# Patient Record
Sex: Female | Born: 1965 | Race: Black or African American | Hispanic: No | Marital: Married | Smoking: Former smoker
Health system: Southern US, Community
[De-identification: ages and names within clinical notes are randomized; demographics above are authoritative.]

## PROBLEM LIST (undated history)

## (undated) DIAGNOSIS — A048 Other specified bacterial intestinal infections: Secondary | ICD-10-CM

## (undated) DIAGNOSIS — K219 Gastro-esophageal reflux disease without esophagitis: Secondary | ICD-10-CM

## (undated) DIAGNOSIS — D649 Anemia, unspecified: Secondary | ICD-10-CM

## (undated) HISTORY — PX: TUBAL LIGATION: SHX77

## (undated) HISTORY — PX: UPPER GI ENDOSCOPY: SHX6162

## (undated) HISTORY — DX: Anemia, unspecified: D64.9

## (undated) HISTORY — DX: Other specified bacterial intestinal infections: A04.8

## (undated) HISTORY — DX: Gastro-esophageal reflux disease without esophagitis: K21.9

---

## 1999-04-30 ENCOUNTER — Other Ambulatory Visit: Admission: RE | Admit: 1999-04-30 | Discharge: 1999-04-30 | Payer: Self-pay | Admitting: Obstetrics and Gynecology

## 2003-01-20 ENCOUNTER — Encounter: Payer: Self-pay | Admitting: Emergency Medicine

## 2003-01-21 ENCOUNTER — Observation Stay (HOSPITAL_COMMUNITY): Admission: EM | Admit: 2003-01-21 | Discharge: 2003-01-21 | Payer: Self-pay | Admitting: *Deleted

## 2003-01-21 ENCOUNTER — Encounter: Payer: Self-pay | Admitting: Internal Medicine

## 2003-08-27 ENCOUNTER — Emergency Department (HOSPITAL_COMMUNITY): Admission: EM | Admit: 2003-08-27 | Discharge: 2003-08-27 | Payer: Self-pay | Admitting: Emergency Medicine

## 2003-09-25 ENCOUNTER — Ambulatory Visit (HOSPITAL_COMMUNITY): Admission: RE | Admit: 2003-09-25 | Discharge: 2003-09-25 | Payer: Self-pay | Admitting: Family Medicine

## 2003-10-08 ENCOUNTER — Ambulatory Visit (HOSPITAL_COMMUNITY): Admission: RE | Admit: 2003-10-08 | Discharge: 2003-10-08 | Payer: Self-pay | Admitting: Family Medicine

## 2003-10-09 ENCOUNTER — Encounter: Admission: RE | Admit: 2003-10-09 | Discharge: 2003-10-09 | Payer: Self-pay | Admitting: Family Medicine

## 2004-03-31 ENCOUNTER — Ambulatory Visit: Payer: Self-pay | Admitting: Family Medicine

## 2004-04-02 ENCOUNTER — Ambulatory Visit (HOSPITAL_COMMUNITY): Admission: RE | Admit: 2004-04-02 | Discharge: 2004-04-02 | Payer: Self-pay | Admitting: Family Medicine

## 2004-04-05 ENCOUNTER — Ambulatory Visit: Payer: Self-pay | Admitting: *Deleted

## 2005-12-07 ENCOUNTER — Emergency Department (HOSPITAL_COMMUNITY): Admission: EM | Admit: 2005-12-07 | Discharge: 2005-12-07 | Payer: Self-pay | Admitting: Emergency Medicine

## 2006-03-18 ENCOUNTER — Encounter (INDEPENDENT_AMBULATORY_CARE_PROVIDER_SITE_OTHER): Payer: Self-pay | Admitting: *Deleted

## 2006-03-18 LAB — CONVERTED CEMR LAB

## 2006-03-24 ENCOUNTER — Ambulatory Visit: Payer: Self-pay | Admitting: Family Medicine

## 2006-04-14 ENCOUNTER — Ambulatory Visit: Payer: Self-pay | Admitting: Family Medicine

## 2006-04-23 ENCOUNTER — Emergency Department (HOSPITAL_COMMUNITY): Admission: EM | Admit: 2006-04-23 | Discharge: 2006-04-23 | Payer: Self-pay | Admitting: Emergency Medicine

## 2006-04-28 ENCOUNTER — Encounter: Admission: RE | Admit: 2006-04-28 | Discharge: 2006-04-28 | Payer: Self-pay | Admitting: *Deleted

## 2006-09-15 ENCOUNTER — Encounter (INDEPENDENT_AMBULATORY_CARE_PROVIDER_SITE_OTHER): Payer: Self-pay | Admitting: *Deleted

## 2006-10-13 ENCOUNTER — Telehealth: Payer: Self-pay | Admitting: *Deleted

## 2006-10-18 ENCOUNTER — Ambulatory Visit: Payer: Self-pay | Admitting: Family Medicine

## 2006-10-18 ENCOUNTER — Encounter (INDEPENDENT_AMBULATORY_CARE_PROVIDER_SITE_OTHER): Payer: Self-pay | Admitting: *Deleted

## 2006-10-18 DIAGNOSIS — D649 Anemia, unspecified: Secondary | ICD-10-CM

## 2006-10-18 DIAGNOSIS — A048 Other specified bacterial intestinal infections: Secondary | ICD-10-CM | POA: Insufficient documentation

## 2006-10-18 LAB — CONVERTED CEMR LAB
BUN: 7 mg/dL (ref 6–23)
CO2: 24 meq/L (ref 19–32)
Calcium: 9.2 mg/dL (ref 8.4–10.5)
Creatinine, Ser: 0.65 mg/dL (ref 0.40–1.20)
Glucose, Bld: 93 mg/dL (ref 70–99)
H Pylori IgG: POSITIVE
Hemoglobin: 8.4 g/dL
Lipase: <10 units/L (ref 0–75)
Platelets: 475 10*3/uL
RBC: 4.45 M/uL
Total Bilirubin: 0.3 mg/dL (ref 0.3–1.2)
WBC: 6.2 10*3/uL

## 2006-10-25 ENCOUNTER — Encounter (INDEPENDENT_AMBULATORY_CARE_PROVIDER_SITE_OTHER): Payer: Self-pay | Admitting: *Deleted

## 2006-10-30 ENCOUNTER — Encounter (INDEPENDENT_AMBULATORY_CARE_PROVIDER_SITE_OTHER): Payer: Self-pay | Admitting: *Deleted

## 2006-11-02 ENCOUNTER — Telehealth: Payer: Self-pay | Admitting: *Deleted

## 2006-11-10 ENCOUNTER — Encounter (INDEPENDENT_AMBULATORY_CARE_PROVIDER_SITE_OTHER): Payer: Self-pay | Admitting: *Deleted

## 2006-12-20 ENCOUNTER — Encounter (INDEPENDENT_AMBULATORY_CARE_PROVIDER_SITE_OTHER): Payer: Self-pay | Admitting: *Deleted

## 2006-12-20 ENCOUNTER — Ambulatory Visit: Payer: Self-pay | Admitting: Family Medicine

## 2006-12-20 DIAGNOSIS — K589 Irritable bowel syndrome without diarrhea: Secondary | ICD-10-CM

## 2006-12-21 LAB — CONVERTED CEMR LAB
Basophils Absolute: 0.1 K/uL
Basophils Relative: 1 %
Eosinophils Absolute: 0.1 K/uL
Eosinophils Relative: 1 %
Ferritin: 1 ng/mL — ABNORMAL LOW
HCT: 30.3 % — ABNORMAL LOW
Hemoglobin: 8.1 g/dL — ABNORMAL LOW
INR: 1.1
Iron: 10 ug/dL — ABNORMAL LOW
Lymphocytes Relative: 13 %
Lymphs Abs: 0.9 K/uL
MCHC: 26.7 g/dL — ABNORMAL LOW
MCV: 66 fL — ABNORMAL LOW
Monocytes Absolute: 0.6 K/uL
Monocytes Relative: 9 %
Neutro Abs: 5.2 K/uL
Neutrophils Relative %: 76 %
Platelets: 410 K/uL — ABNORMAL HIGH
Prothrombin Time: 14.8 s
RBC: 4.59 M/uL
RDW: 19.2 % — ABNORMAL HIGH
Retic Count, Absolute: 36.7
Retic Ct Pct: 0.8 %
Saturation Ratios: 3 % — ABNORMAL LOW
TIBC: 400 ug/dL
UIBC: 390 ug/dL
WBC: 6.9 10*3/microliter
aPTT: 33 s

## 2007-05-23 ENCOUNTER — Encounter (INDEPENDENT_AMBULATORY_CARE_PROVIDER_SITE_OTHER): Payer: Self-pay | Admitting: *Deleted

## 2007-05-23 ENCOUNTER — Ambulatory Visit: Payer: Self-pay | Admitting: Family Medicine

## 2007-05-23 LAB — CONVERTED CEMR LAB
Bilirubin Urine: NEGATIVE
Glucose, Urine, Semiquant: NEGATIVE
Hemoglobin: 11.9 g/dL — ABNORMAL LOW (ref 12.0–15.0)
MCHC: 29.8 g/dL — ABNORMAL LOW (ref 30.0–36.0)
Platelets: 282 10*3/uL (ref 150–400)
Protein, U semiquant: 30
RDW: 17.4 % — ABNORMAL HIGH (ref 11.5–14.0)
Urobilinogen, UA: 0.2
pH: 7

## 2007-06-06 ENCOUNTER — Encounter: Admission: RE | Admit: 2007-06-06 | Discharge: 2007-06-06 | Payer: Self-pay | Admitting: Neurology

## 2007-06-12 ENCOUNTER — Encounter: Admission: RE | Admit: 2007-06-12 | Discharge: 2007-06-12 | Payer: Self-pay | Admitting: Sports Medicine

## 2010-01-14 ENCOUNTER — Ambulatory Visit: Payer: Self-pay | Admitting: Family Medicine

## 2010-01-14 ENCOUNTER — Ambulatory Visit (HOSPITAL_COMMUNITY): Admission: RE | Admit: 2010-01-14 | Discharge: 2010-01-14 | Payer: Self-pay | Admitting: Family Medicine

## 2010-01-22 ENCOUNTER — Encounter: Payer: Self-pay | Admitting: Family Medicine

## 2010-02-04 ENCOUNTER — Ambulatory Visit: Payer: Self-pay | Admitting: Family Medicine

## 2010-02-04 ENCOUNTER — Telehealth: Payer: Self-pay | Admitting: Family Medicine

## 2010-02-04 LAB — CONVERTED CEMR LAB: Pap Smear: NEGATIVE

## 2010-02-08 ENCOUNTER — Encounter: Payer: Self-pay | Admitting: Family Medicine

## 2010-03-10 ENCOUNTER — Encounter: Payer: Self-pay | Admitting: Family Medicine

## 2010-03-10 ENCOUNTER — Ambulatory Visit: Payer: Self-pay | Admitting: Family Medicine

## 2010-03-10 LAB — CONVERTED CEMR LAB
ALT: 9 units/L (ref 0–35)
Albumin: 4.2 g/dL (ref 3.5–5.2)
CO2: 22 meq/L (ref 19–32)
Calcium: 8.9 mg/dL (ref 8.4–10.5)
Chloride: 105 meq/L (ref 96–112)
Cholesterol: 176 mg/dL (ref 0–200)
Glucose, Bld: 83 mg/dL (ref 70–99)
Platelets: 226 10*3/uL (ref 150–400)
Potassium: 4 meq/L (ref 3.5–5.3)
RBC: 4.67 M/uL (ref 3.87–5.11)
Sodium: 138 meq/L (ref 135–145)
Total Bilirubin: 0.4 mg/dL (ref 0.3–1.2)
Total Protein: 6.7 g/dL (ref 6.0–8.3)
Triglycerides: 47 mg/dL (ref <150)
VLDL: 9 mg/dL (ref 0–40)
WBC: 4.3 10*3/uL (ref 4.0–10.5)

## 2010-03-11 ENCOUNTER — Encounter: Payer: Self-pay | Admitting: Family Medicine

## 2010-03-18 ENCOUNTER — Telehealth: Payer: Self-pay | Admitting: Family Medicine

## 2010-08-08 ENCOUNTER — Encounter: Payer: Self-pay | Admitting: Sports Medicine

## 2010-08-17 NOTE — Letter (Signed)
Summary: Results Follow-up Letter  [facility]  [address]   [place], [place] [address]   Phone: [phone]  Fax: [phone]    02/08/2010  [address] [place], [place]  [address]  Dear Ms. Hartline,   The following are the results of your recent test(s):  Test     Result     Pap Smear    Normal_____x__  Not Normal_____       Comments: repeat in 2 years   Please call for an appointment Or _________________________________________________________ _________________________________________________________ _________________________________________________________  Sincerely,  Ellery Plunk MD [facility]           Appended Document: Results Follow-up Letter mailed

## 2010-08-17 NOTE — Assessment & Plan Note (Signed)
Summary: multiple issues,tcb   Vital Signs:  Patient profile:   45 year old female Height:      67.5 inches Weight:      162.9 pounds BMI:     25.23 Temp:     98.7 degrees F oral Pulse rate:   88 / minute BP sitting:   135 / 83  (left arm) Cuff size:   regular  Vitals Entered By: Gladstone Pih (January 14, 2010 2:02 PM) CC: GI upset Is Patient Diabetic? No   Primary Care Provider:  . [facility]  CC:  GI upset.  History of Present Illness: GI upset-complains of >6 years of GI upset including hard stool, bloating, abdominal pain, and chest discomfort. stool- hard, daily BM.  no blood or mucous.  has to strain.  abd pain is often relieved by BM.   abd pain- in left and right upper quadrants.  not consistent.   chest discomfort- occ burning, r and l sided pain.  feels like gas or like a sharp stabbing pain.  also right shoulder will hurt, relieved by rubbing. pain better with lying down, BMs.  worse with milk, big, fatty meals no weight loss, no fevers  Habits & Providers  Alcohol-Tobacco-Diet     Tobacco Status: quit     Tobacco Counseling: to quit use of tobacco products     Year Quit: 2006  Current Medications (verified): 1)  None  Allergies (verified): No Known Drug Allergies  Review of Systems       The patient complains of abdominal pain.  The patient denies anorexia, fever, weight loss, weight gain, chest pain, syncope, difficulty walking, depression, and abnormal bleeding.    Physical Exam  General:  Well-developed,well-nourished,in no acute distress; alert,appropriate and cooperative throughout examination Abdomen:  easily palpable aorta 3.5 cm bowel sounds hyperactive.  mild tenderness LLQ   Impression & Recommendations:  Problem # 1:  IRRITABLE BOWEL SYNDROME (ICD-564.1) Assessment Unchanged symptoms consistent with IBS, could have componenet of lactose intolerance.  advised avoid milk, increase fiber.  RTC 1 month for PE and recheck.  consider  checking h pylori breath test as has had twice in past.  also, recheck CBC as hx of anemia and no longer taking iron. Orders: [facility]- Est Level  3 (69629)  Other Orders: EKG- [facility] (EKG)  Patient Instructions: 1)  It was nice to meet you today 2)  make an appt for a physical 1 month from now.  We can also see how you are doing with your stomach. 3)  I think this is likely IBS. 4)  Here are some things you can do: 5)  -increase the number of fruits and vegetables you eat.  Try to eat at least one serving with each meal.  Also, try taking metamucil once a day.  Fiber can add to problems with gas, so increase slowly.  Take metamucil once a day for 2 weeks, then increase to twice a day. 6)  -drink plenty of water, 8 glasses a day. 7)  -avoid dairy when possible 8)  -when you have heartburn, take tums and see if that helps you 9)  Avoid foods high in acid(tomatoes, citrus juices,spicy foods).Avoid eating within two hours of lying down or before exercising. Do not over eat: try smaller more frequent meals.    Prevention & Chronic Care Immunizations   Influenza vaccine: Fluvax 3+  (05/23/2007)    Tetanus booster: 03/18/2006: Done.    Pneumococcal vaccine: Not documented  Other Screening  Pap smear: Done.  (03/18/2006)   Pap smear action/deferral: Deferred  (01/14/2010)    Mammogram: normal  (06/13/2007)   Mammogram due: 06/2008   Smoking status: quit  (01/14/2010)  Lipids   Total Cholesterol: Not documented   Lipid panel action/deferral: Deferred   LDL: Not documented   LDL Direct: Not documented   HDL: Not documented   Triglycerides: Not documented   Lipid panel due: 02/13/2010

## 2010-08-17 NOTE — Letter (Signed)
Summary: Generic Letter  [facility]  [address]   [place], [place] [address]   Phone: [phone]  Fax: [phone]    03/11/2010  [facility] [address] [place], [place]  [address]  Dear Ms. Coffield,   I wanted to let you know that your cholesterol looked good and your blood counts still showed that you are anemic.  Please start Iron once a day.  Continue with the fiber, this should help with any constipation effect that the iron may give you.        Sincerely,   Ellery Plunk MD  Appended Document: Generic Letter mailed

## 2010-08-17 NOTE — Assessment & Plan Note (Signed)
Summary: CPE/KH   Vital Signs:  Patient profile:   45 year old female Height:      67.5 inches Weight:      165 pounds BMI:     25.55 Temp:     98.6 degrees F oral Pulse rate:   74 / minute BP sitting:   131 / 88  (right arm) Cuff size:   regular  Vitals Entered By: Jimmy Footman, CMA (February 04, 2010 9:31 AM) CC: adult physical Is Patient Diabetic? No Pain Assessment Patient in pain? no        Primary Care Provider:  Ellery Plunk MD  CC:  adult physical.  History of Present Illness: pt here today for check up and f/u of abd pain.    anemia-not taking iron.  no blood in stool.  usually heavy periods but missed last month.  no possibility of pregnancy./  had BTL 19 years ago.  does not feel tired or weak.    abd pain- improved.  stopped drinking milk or dairy products.  still feels bloated and still has hard stool.  takes one dose of metamucil daily.    reproductive health-  has not had pap in 3 years but has never had abnormal.  no complaint of dryness, iritation or d/c.    Habits & Providers  Alcohol-Tobacco-Diet     Tobacco Status: never  Current Medications (verified): 1)  Nexium 20 Mg Cpdr (Esomeprazole Magnesium) .... Take One Daily  Allergies (verified): No Known Drug Allergies  Past History:  Past Medical History: Last updated: 09/14/2006 H pylori in 2004, h pylori in 03/2006, trich in 2004  Past Surgical History: Last updated: 09/14/2006 Tubal ligation - 07/18/1989  Social History: Smoking Status:  never  Review of Systems       The patient complains of abdominal pain.  The patient denies anorexia, fever, weight loss, vision loss, decreased hearing, hoarseness, chest pain, syncope, headaches, melena, hematochezia, severe indigestion/heartburn, muscle weakness, difficulty walking, and depression.    Physical Exam  General:  Well-developed,well-nourished,in no acute distress; alert,appropriate and cooperative throughout examination Head:   Normocephalic and atraumatic without obvious abnormalities. No apparent alopecia or balding. Breasts:  skin/areolae normal, no masses, no abnormal thickening, no nipple discharge, no tenderness, and no adenopathy.   Lungs:  Normal respiratory effort, chest expands symmetrically. Lungs are clear to auscultation, no crackles or wheezes. Heart:  Normal rate and regular rhythm. S1 and S2 normal without gallop, murmur, click, rub or other extra sounds. Abdomen:  Bowel sounds positive,abdomen soft and non-tender without masses, organomegaly or hernias noted. Genitalia:  Pelvic Exam:        External: normal female genitalia without lesions or masses        Vagina: normal without lesions or masses        Cervix: normal without lesions or masses        Adnexa: normal bimanual exam without masses or fullness        Uterus: normal by palpation        Pap smear: performed Pulses:  R radial normal and L radial normal.   Extremities:  No clubbing, cyanosis, edema, or deformity noted with normal full range of motion of all joints.   Neurologic:  No cranial nerve deficits noted. Station and gait are normal.Sensory, motor and coordinative functions appear intact. Skin:  Intact without suspicious lesions or rashes Cervical Nodes:  No lymphadenopathy noted Axillary Nodes:  No palpable lymphadenopathy Psych:  Cognition and judgment appear intact. Alert and  cooperative with normal attention span and concentration. No apparent delusions, illusions, hallucinations   Impression & Recommendations:  Problem # 1:  IRRITABLE BOWEL SYNDROME (ICD-564.1) Assessment Improved gave up lactose with some improvement.  still complaining of bloating.  with hx of h pylori, will get breath test to make sure that is not part of problem.  pt states that she had an EGD years ago.  will look for that record. continue to increase metamucil. Orders: [facility]- Est  Level 4 (99214)Future Orders: Comp Met-[facility] ([id number]) ...  02/03/2011  Problem # 2:  ANEMIA NOS (ICD-285.9) Assessment: Unchanged not been checked in years.  not on iron.  still usually with heavy periods.  will check CBC and consider restarting iron Orders: [facility]- Est  Level 4 (99214)Future Orders: CBC-[facility] (40102) ... 02/03/2011  Problem # 3:  PHYSICAL EXAMINATION (ICD-V70.0) Assessment: Unchanged now 39ys old .  Pap today, could go to every 2 years.  pt desires mammogram.  discussed risks vs benefits of this.  FLP at lab appt to assess cards risk.  otherwise doing well Orders: Pap Smear-[facility] ([id number]) [facility]- Est  Level 4 (47425)  Complete Medication List: 1)  Nexium 20 Mg Cpdr (Esomeprazole magnesium) .... Take one daily  Other Orders: Mammogram (Screening) (Mammo) Future Orders: Lipid-[facility] ([id number]) ... 01/20/2011 Miscellaneous Lab Charge-[facility] [id number]) ... 01/28/2011  Patient Instructions: 1)  It was nice to see you again! 2)  I am glad you are feeling better.  Keep up with the fiber and water and exercise.  You can increase the metamucil to 2 times per day for a week, then 3 times per day until you have 1 soft stool per day.   3)  Go get your mammogram. 4)  make a lab appt to come back  for cholesterol and other blood work.  you can have your H pylori checked then.  Do not eat or drink after midnight the night before.  You can have water, black coffee or tea  Prevention & Chronic Care Immunizations   Influenza vaccine: Fluvax 3+  (05/23/2007)   Influenza vaccine deferral: Not indicated  (02/04/2010)    Tetanus booster: 03/18/2006: Done.   Tetanus booster due: 03/18/2016    Pneumococcal vaccine: Not documented   Pneumococcal vaccine deferral: Not indicated  (02/04/2010)  Other Screening   Pap smear: Done.  (03/18/2006)   Pap smear action/deferral: Ordered  (02/04/2010)   Pap smear due: 02/05/2012    Mammogram: normal  (06/13/2007)   Mammogram action/deferral: Ordered  (02/04/2010)   Mammogram due: 06/2008   Smoking  status: never  (02/04/2010)  Lipids   Total Cholesterol: Not documented   Lipid panel action/deferral: Lipid Panel ordered   LDL: Not documented   LDL Direct: Not documented   HDL: Not documented   Triglycerides: Not documented   Lipid panel due: 02/13/2010   Nursing Instructions: Schedule screening mammogram (see order)   Appended Document: CPE/KH EGD 2008 with [facility] normal

## 2010-08-17 NOTE — Progress Notes (Signed)
  Phone Note Outgoing Call   Call placed by: Ellery Plunk MD,  February 04, 2010 11:41 AM Summary of Call: VM left.  pt should not start nexium until she has had her H pylori breath test.  Also she should not have anything to drink 1 hour before. Initial call taken by: Ellery Plunk MD,  February 04, 2010 11:42 AM

## 2010-08-17 NOTE — Progress Notes (Signed)
Summary: refill  Phone Note Refill Request Call back at Home Phone [phone] Message from:  Patient  Refills Requested: Medication #1:  FERRO-BOB 325 (65 FE) MG TABS. [facility] [address]  Initial call taken by: De Nurse,  March 18, 2010 8:49 AM    New/Updated Medications: FERROUS SULFATE 325 (65 FE) MG TBEC (FERROUS SULFATE) take one by mouth daily Prescriptions: FERROUS SULFATE 325 (65 FE) MG TBEC (FERROUS SULFATE) take one by mouth daily  #30 x 11   Entered and Authorized by:   Ellery Plunk MD   Signed by:   Ellery Plunk MD on 03/23/2010   Method used:   Electronically to        [facility] [phone]* (retail)       [address]       [place], [place]  [address]       Ph: [phone]       Fax: [phone]   RxID:   [id number]

## 2010-08-17 NOTE — Miscellaneous (Signed)
  Clinical Lists Changes  Medications: Added new medication of FERRO-BOB 325 (65 FE) MG TABS (FERROUS SULFATE) Observations: Added new observation of PAST MED HX: H pylori in 2004, h pylori in 03/2006, trich in 2004 microcytic anemia (03/11/2010 10:54)      Past History:  Past Medical History: H pylori in 2004, h pylori in 03/2006, trich in 2004 microcytic anemia

## 2010-09-02 ENCOUNTER — Encounter: Payer: Self-pay | Admitting: *Deleted

## 2010-09-09 ENCOUNTER — Ambulatory Visit (INDEPENDENT_AMBULATORY_CARE_PROVIDER_SITE_OTHER): Payer: BC Managed Care – PPO | Admitting: Family Medicine

## 2010-09-09 ENCOUNTER — Encounter: Payer: Self-pay | Admitting: Family Medicine

## 2010-09-09 VITALS — BP 134/78 | HR 94 | Temp 98.4°F | Ht 68.0 in | Wt 186.1 lb

## 2010-09-09 DIAGNOSIS — M5430 Sciatica, unspecified side: Secondary | ICD-10-CM | POA: Insufficient documentation

## 2010-09-09 MED ORDER — PREDNISONE 20 MG PO TABS: 40.0000 mg | ORAL_TABLET | Freq: Every day | ORAL | Status: AC

## 2010-09-09 MED ORDER — CYCLOBENZAPRINE HCL 10 MG PO TABS: ORAL_TABLET | ORAL | Status: DC

## 2010-09-09 MED ORDER — MELOXICAM 15 MG PO TABS: ORAL_TABLET | ORAL | Status: DC

## 2010-09-09 NOTE — Patient Instructions (Signed)
Expect to improve over next few weeks.  See handout to review out discussion on red flags to return.  If no improvement in 6 weeks, please follow-up.

## 2010-09-09 NOTE — Assessment & Plan Note (Signed)
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  No red flags.  Given meloxicam and flexeril.  Patient to decide is prednisone is needed after trying.  Advised on red flags for return or if no improvement in 4-6 weeks.

## 2010-09-09 NOTE — Progress Notes (Signed)
  Subjective:    Patient ID: Alicia Gould, female    DOB: June 30, 1966, 45 y.o.   MRN: [id number]  HPI 7-10 days of left sided low back pain with radiculopathy down left leg.  No new physical activity or injury.  Does note 20 pounds weight gain in last 6 months.  Pain is in hip radiating down back of left leg described as an ache.  Has some paraesthesias in left foot like it "fell asleep"  Has had some relief with ibuprofen and aleve.  No change in bowel or bladder habits, or fever/chills, weakness.  Has not have chronic back pain or back surgery.    Review of Systems Neg except as per HPI    Objective:   Physical Exam  Constitutional: She is oriented to person, place, and time. She appears well-developed and well-nourished.  Neck: Normal range of motion.  Neurological: She is alert and oriented to person, place, and time. She has normal strength and normal reflexes. She displays no atrophy. No cranial nerve deficit or sensory deficit. She exhibits normal muscle tone.  Reflex Scores:      Patellar reflexes are 2+ on the right side and 2+ on the left side.      Achilles reflexes are 2+ on the right side and 2+ on the left side.      Normal sensation to light touch in lower extremities bilaterally.  Negative straight leg raise.  No pain over palpation of entire spine.  Slightly tender over left buttock          Assessment & Plan:

## 2010-12-03 NOTE — Consult Note (Signed)
Alicia Gould, Alicia Gould                             ACCOUNT NO.:  [id number]   MEDICAL RECORD NO.:  [id number]                   PATIENT TYPE:  INP   LOCATION:  2019                                 FACILITY:  [facility]   PHYSICIAN:  Rollene Rotunda, M.D.                DATE OF BIRTH:  Sep 23, 1965   DATE OF CONSULTATION:  01/21/2003  DATE OF DISCHARGE:                                   CONSULTATION   REFERRING PHYSICIAN:  Lazaro Arms, M.D.   REASON FOR CONSULTATION:  The patient has arm pain.   CARDIAC HISTORY:  The patient is a pleasant, 45 year old African-American  female with no prior cardiac history.  She said that she was doing well  until 5 o'clock after she woke up.  She noticed discomfort in her left  elbow.  This was unlike pain that she had before. It was an aching.  It did  radiate up her arm to her shoulder.  There was some discomfort across her  back. It was moderate in intensity. She was slightly diaphoretic but not  severely. She did not have any nausea or vomiting.  She did feel her heart  starting to race and she began to panic.  She took an aspirin. She presented  to the emergency room.  Symptoms apparently responded spontaneously. She was  slightly hypertensive when she got to the emergency room.  She had  nonspecific EKG changes.  She has been pain free since then.  There have  been no enzyme changes.  She has been relatively inactive though she does  such things as push a vacuum cleaner.  With this she denies any chest  discomfort at home. She has had no shortness of breath, PND, or orthopnea.  She has had no presyncope or syncope.   PAST MEDICAL HISTORY:  The patient has no history of hypertension, diabetes,  or hyperlipidemia.   PAST SURGICAL HISTORY:  Tubal ligation.   ALLERGIES:  None.   MEDICATIONS HOME:  None.   SOCIAL HISTORY:  The patient is married.  She has 3 children.  She works at  a [employer].  She does smoke marijuana.   FAMILY  HISTORY:  Contributory for her brother having myocardial infarction  at the age of 54; had angioplasty last year.   REVIEW OF SYSTEMS:  As stated in the HPI.  Otherwise negative for other  systems.   PHYSICAL EXAMINATION:  GENERAL:  The patient is in no distress.  VITAL SIGNS:  Blood pressure 120/60, heart rate 80 and regular.  HEENT:  Eyes unremarkable.  Pupils are equal, round, and react to light.  Fundi visualized.  Oral mucosa unremarkable.  NECK:  No jugular venous distention, wave form within normal limits.  Carotid upstroke brisk and symmetrical.  No bruits, thyromegaly.  LYMPHATICS:  No cervical, axillary, or inguinal adenopathy.  LUNGS:  Clear to auscultation bilaterally.  BACK:  No costovertebral angle tenderness.  CHEST:  Unremarkable.  HEART:  PMI not displaced or sustained.  S1, S2 within normal limits.  No  S3, S4.  No murmurs.  ABDOMEN:  Flat, positive bowel sounds, normal in frequency and pitch.  No  bruits, no rebound, no guarding in the midline, no pulsatile masses, no  hepatomegaly, no splenomegaly.  SKIN:  No rash, no nodule.  EXTREMITIES:  2+ pulses without edema.  NEUROLOGIC:  Oriented to person, place, and time.  Cranial nerves II-XII  grossly intact.  Motor grossly intact.   EKG sinus rhythm, rate 98, axis within normal limits, intervals within  normal limits, nonspecific anterolateral T wave flattening.   LABS:  CK 82, MB 13, troponin 0.01.  WBC 8.0, hemoglobin 10.6.  Sodium 140,  potassium 3.2, chloride 110, BUN 7, creatinine 0.8.   ASSESSMENT AND PLAN:  1. Arm discomfort.  The patient's arm discomfort is somewhat atypical for     angina.  She does have risk factors, however.  There is no objective     evidence of ischemia.  At this point, I think that she can safely have a     stress perfusion study.  Further evaluation will be based on these     results.  2. Tobacco.  We discussed the need to stop smoking.  3. Risk reduction.  We will check a lipid  profile.  A Framingham risk score     can be calculated and treatment administered per guidelines.                                               Rollene Rotunda, M.D.    JH/MEDQ  D:  01/21/2003  T:  01/21/2003  Job:  [id number]   cc:   Lazaro Arms, M.D.  [address] [place], [place] [address]  Fax: [phone]

## 2013-10-30 ENCOUNTER — Encounter: Payer: Self-pay | Admitting: Family Medicine

## 2013-10-30 ENCOUNTER — Ambulatory Visit (INDEPENDENT_AMBULATORY_CARE_PROVIDER_SITE_OTHER): Payer: BC Managed Care – PPO | Admitting: Family Medicine

## 2013-10-30 VITALS — BP 134/90 | HR 80 | Temp 97.8°F | Ht 67.0 in | Wt 187.6 lb

## 2013-10-30 DIAGNOSIS — R42 Dizziness and giddiness: Secondary | ICD-10-CM | POA: Insufficient documentation

## 2013-10-30 DIAGNOSIS — D179 Benign lipomatous neoplasm, unspecified: Secondary | ICD-10-CM | POA: Insufficient documentation

## 2013-10-30 LAB — CBC
HCT: 43.7 % (ref 36.0–46.0)
Hemoglobin: 14.9 g/dL (ref 12.0–15.0)
MCH: 29.1 pg (ref 26.0–34.0)
MCHC: 34.1 g/dL (ref 30.0–36.0)
MCV: 85.4 fL (ref 78.0–100.0)
PLATELETS: 247 10*3/uL (ref 150–400)
RBC: 5.12 MIL/uL — ABNORMAL HIGH (ref 3.87–5.11)
RDW: 13.9 % (ref 11.5–15.5)
WBC: 5.4 10*3/uL (ref 4.0–10.5)

## 2013-10-30 LAB — BASIC METABOLIC PANEL
BUN: 9 mg/dL (ref 6–23)
CHLORIDE: 105 meq/L (ref 96–112)
CO2: 28 mEq/L (ref 19–32)
CREATININE: 0.92 mg/dL (ref 0.50–1.10)
Calcium: 10 mg/dL (ref 8.4–10.5)
Glucose, Bld: 99 mg/dL (ref 70–99)
POTASSIUM: 3.7 meq/L (ref 3.5–5.3)
Sodium: 139 mEq/L (ref 135–145)

## 2013-10-30 LAB — TSH: TSH: 2.578 u[IU]/mL (ref 0.350–4.500)

## 2013-10-30 NOTE — Assessment & Plan Note (Signed)
A: Most consistent with lipoma. No red flags, but bothersome to pt  P: - Offered u/s to evaluate tumor vs. Referral to surgery for excision - Will get U/s now and go from there - Patient will consider surgery eval.

## 2013-10-30 NOTE — Patient Instructions (Signed)
Lets get your labs today, I will call you if they are abnormal. Please drink more water.  We will also get an ultrasound of your leg.   I will see you back in about 1 month for a check up.  Keilin Gamboa M. Delphina Schum, M.D.

## 2013-10-30 NOTE — Assessment & Plan Note (Signed)
A: Intermittent, quick episodes of dizziness. DDx includes intermittent elevated BP, dehydration, anemia.  P: - CBC, Bmet and TSH today - Push fluids - Keep track of these episodes and any inciting events - F/u in 1 month or sooner if needed

## 2013-10-30 NOTE — Progress Notes (Signed)
Patient ID: Alicia Gould, female   DOB: December 28, 1965, 48 y.o.   MRN: [id number]    Subjective: HPI: Patient is a 48 y.o. female presenting to clinic today for follow up appointment. Concerns today include dizziness/lightheadness  1. Dizziness - Had ruptured blood vessel in eye last month. Eye doctor suggested she check her blood pressure. She checked it that day at work and reports it was "fair". She has never had high blood pressure in the past. Her BP today is 134/90. Reports she has quick intermittent lightheaded or off balance. She states it quickly resolves within a minute. Associated with nausea. She has not passed out. She reports these episodes have been going on for 1-2 months. Her last blood work. She is post-menopausal. Not drinking a lot of water. Has history of anemia, not currently on iron.  2. Growth on right thigh- She reports a large growth on inner thigh. She reports it does not hurt. She states it has been there about a year. Not changing. No redness, no fevers.  History Reviewed: Former smoker. Health Maintenance: Last labs 2011  ROS: Please see HPI above.  Objective: Office vital signs reviewed. BP 134/90  Pulse 80  Temp(Src) 97.8 F (36.6 C) (Oral)  Ht 5\' 7"  (1.702 m)  Wt 187 lb 9.6 oz (85.095 kg)  BMI 29.38 kg/m2  LMP 10/16/2012  Physical Examination:  General: Awake, alert. NAD.  HEENT: Atraumatic, normocephalic. MMM. EOMI without nystagmus Pulm: CTAB, no wheezes Cardio: RRR, no murmurs appreciated Abdomen:+BS, soft, nontender, nondistended Extremities: No edema. 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 Discrete without tenderness or erythema Neuro: Grossly intact  Assessment: 48 y.o. female follow up appointment  Plan: See Problem List and After Visit Summary

## 2013-11-04 ENCOUNTER — Ambulatory Visit (HOSPITAL_COMMUNITY)
Admission: RE | Admit: 2013-11-04 | Discharge: 2013-11-04 | Disposition: A | Discharge status: Home / Self Care | Payer: BC Managed Care – PPO | Source: Ambulatory Visit | Attending: Family Medicine | Admitting: Family Medicine

## 2013-11-04 ENCOUNTER — Encounter: Payer: Self-pay | Admitting: Family Medicine

## 2013-11-04 DIAGNOSIS — D179 Benign lipomatous neoplasm, unspecified: Secondary | ICD-10-CM

## 2013-11-04 DIAGNOSIS — R229 Localized swelling, mass and lump, unspecified: Secondary | ICD-10-CM | POA: Insufficient documentation

## 2013-11-05 ENCOUNTER — Other Ambulatory Visit: Payer: Self-pay | Admitting: Family Medicine

## 2013-11-05 DIAGNOSIS — D179 Benign lipomatous neoplasm, unspecified: Secondary | ICD-10-CM

## 2013-11-05 NOTE — Progress Notes (Signed)
Spoke with patient about ultrasound results. 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  Will place referral to general surgery for further evaluation.  Thanks, [employer]. Suki Crockett, M.D.

## 2013-11-22 ENCOUNTER — Ambulatory Visit (INDEPENDENT_AMBULATORY_CARE_PROVIDER_SITE_OTHER): Payer: BC Managed Care – PPO | Admitting: Surgery

## 2013-12-10 ENCOUNTER — Telehealth (INDEPENDENT_AMBULATORY_CARE_PROVIDER_SITE_OTHER): Payer: Self-pay | Admitting: Surgery

## 2013-12-10 ENCOUNTER — Encounter (INDEPENDENT_AMBULATORY_CARE_PROVIDER_SITE_OTHER): Payer: Self-pay | Admitting: Surgery

## 2013-12-10 ENCOUNTER — Ambulatory Visit (INDEPENDENT_AMBULATORY_CARE_PROVIDER_SITE_OTHER): Payer: BC Managed Care – PPO | Admitting: Surgery

## 2013-12-10 VITALS — BP 128/76 | HR 64 | Resp 12 | Ht 68.0 in | Wt 188.0 lb

## 2013-12-10 DIAGNOSIS — D179 Benign lipomatous neoplasm, unspecified: Secondary | ICD-10-CM

## 2013-12-10 NOTE — Telephone Encounter (Signed)
Patient met with surgery scheduling, went over financial responsibilities, will call back to schedule.

## 2013-12-10 NOTE — Patient Instructions (Signed)

## 2013-12-10 NOTE — Progress Notes (Signed)
Patient ID: Alicia Gould, female   DOB: February 27, 1966, 48 y.o.   MRN: [id number]  No chief complaint on file.   HPI Alicia SHRIEVES is a 48 y.o. female.  Patient sat request of  HPI [name], MD for right thigh mass. This is been present for at least 6 months. It is getting larger. Location right inner thigh. Mild discomfort over a period no redness or drainage.   Past Medical History  Diagnosis Date  . Anemia     Past Surgical History  Procedure Laterality Date  . Tubal ligation      Family History  Problem Relation Age of Onset  . Ovarian cancer Maternal Grandmother     Social History History  Substance Use Topics  . Smoking status: Current Every Day Smoker -- 0.50 packs/day    Types: Cigarettes  . Smokeless tobacco: Not on file  . Alcohol Use: Not on file    No Known Allergies  No current outpatient prescriptions on file.   No current facility-administered medications for this visit.    Review of Systems Review of Systems  Constitutional: Negative for fever, chills and unexpected weight change.  HENT: Negative for congestion, hearing loss, sore throat, trouble swallowing and voice change.   Eyes: Negative for visual disturbance.  Respiratory: Negative for cough and wheezing.   Cardiovascular: Negative for chest pain, palpitations and leg swelling.  Gastrointestinal: Negative for nausea, vomiting, abdominal pain, diarrhea, constipation, blood in stool, abdominal distention and anal bleeding.  Genitourinary: Negative for hematuria, vaginal bleeding and difficulty urinating.  Musculoskeletal: Negative for arthralgias.  Skin: Negative for rash and wound.  Neurological: Negative for seizures, syncope and headaches.  Hematological: Negative for adenopathy. Does not bruise/bleed easily.  Psychiatric/Behavioral: Negative for confusion.    Blood pressure 128/76, pulse 64, resp. rate 12, height 5\' 8"  (1.727 m), weight 188 lb (85.276 kg).  Physical Exam Physical  Exam  Constitutional: She is oriented to person, place, and time. She appears well-developed and well-nourished.  HENT:  Head: Normocephalic.  Mouth/Throat: No oropharyngeal exudate.  Eyes: EOM are normal. Pupils are equal, round, and reactive to light.  Neck: Normal range of motion. Neck supple.  Cardiovascular: Normal rate and regular rhythm.   Pulmonary/Chest: Effort normal and breath sounds normal.  Musculoskeletal: Normal range of motion.  Lymphadenopathy:    She has no cervical adenopathy.  Neurological: She is alert and oriented to person, place, and time.  Skin:     Psychiatric: She has a normal mood and affect. Her behavior is normal. Judgment and thought content normal.      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  Alternative therapies have been discussed with the patient.  Operative risks include bleeding,  Infection,  Organ injury,  Nerve injury,  Blood vessel injury,  DVT,  Pulmonary embolism,  Death,  And possible reoperation.  Medical management risks include worsening of present situation.  The success of the procedure is 50 -90 % at treating patients symptoms.  The patient understands and agrees to proceed.       Lorrin Nawrot A. Maripat Borba 12/10/2013, 9:38 AM

## 2014-05-07 ENCOUNTER — Encounter: Payer: Self-pay | Admitting: Family Medicine

## 2014-05-07 ENCOUNTER — Ambulatory Visit (INDEPENDENT_AMBULATORY_CARE_PROVIDER_SITE_OTHER): Payer: BC Managed Care – PPO | Admitting: Family Medicine

## 2014-05-07 VITALS — BP 148/83 | HR 101 | Temp 98.8°F | Ht 68.0 in | Wt 183.0 lb

## 2014-05-07 DIAGNOSIS — R300 Dysuria: Secondary | ICD-10-CM | POA: Diagnosis not present

## 2014-05-07 DIAGNOSIS — N390 Urinary tract infection, site not specified: Secondary | ICD-10-CM | POA: Diagnosis not present

## 2014-05-07 DIAGNOSIS — R319 Hematuria, unspecified: Secondary | ICD-10-CM | POA: Diagnosis not present

## 2014-05-07 LAB — POCT URINALYSIS DIPSTICK
Bilirubin, UA: NEGATIVE
Glucose, UA: NEGATIVE
KETONES UA: NEGATIVE
NITRITE UA: NEGATIVE
PROTEIN UA: 30
Spec Grav, UA: 1.015
UROBILINOGEN UA: 0.2
pH, UA: 5.5

## 2014-05-07 LAB — POCT UA - MICROSCOPIC ONLY

## 2014-05-07 MED ORDER — CEPHALEXIN 500 MG PO CAPS: 500.0000 mg | ORAL_CAPSULE | Freq: Four times a day (QID) | ORAL | Status: DC

## 2014-05-07 NOTE — Patient Instructions (Signed)
It was nice to see you today.  I have sent in an antibiotic for you. Please take as prescribed.  Urinary Tract Infection Urinary tract infections (UTIs) can develop anywhere along your urinary tract. Your urinary tract is your body's drainage system for removing wastes and extra water. Your urinary tract includes two kidneys, two ureters, a bladder, and a urethra. Your kidneys are a pair of bean-shaped organs. Each kidney is about the size of your fist. They are located below your ribs, one on each side of your spine. CAUSES Infections are caused by microbes, which are microscopic organisms, including fungi, viruses, and bacteria. These organisms are so small that they can only be seen through a microscope. Bacteria are the microbes that most commonly cause UTIs. SYMPTOMS  Symptoms of UTIs may vary by age and gender of the patient and by the location of the infection. Symptoms in young women typically include a frequent and intense urge to urinate and a painful, burning feeling in the bladder or urethra during urination. Older women and men are more likely to be tired, shaky, and weak and have muscle aches and abdominal pain. A fever may mean the infection is in your kidneys. Other symptoms of a kidney infection include pain in your back or sides below the ribs, nausea, and vomiting. DIAGNOSIS To diagnose a UTI, your caregiver will ask you about your symptoms. Your caregiver also will ask to provide a urine sample. The urine sample will be tested for bacteria and white blood cells. White blood cells are made by your body to help fight infection. TREATMENT  Typically, UTIs can be treated with medication. Because most UTIs are caused by a bacterial infection, they usually can be treated with the use of antibiotics. The choice of antibiotic and length of treatment depend on your symptoms and the type of bacteria causing your infection. HOME CARE INSTRUCTIONS  If you were prescribed antibiotics, take  them exactly as your caregiver instructs you. Finish the medication even if you feel better after you have only taken some of the medication.  Drink enough water and fluids to keep your urine clear or pale yellow.  Avoid caffeine, tea, and carbonated beverages. They tend to irritate your bladder.  Empty your bladder often. Avoid holding urine for long periods of time.  Empty your bladder before and after sexual intercourse.  After a bowel movement, women should cleanse from front to back. Use each tissue only once. SEEK MEDICAL CARE IF:   You have back pain.  You develop a fever.  Your symptoms do not begin to resolve within 3 days. SEEK IMMEDIATE MEDICAL CARE IF:   You have severe back pain or lower abdominal pain.  You develop chills.  You have nausea or vomiting.  You have continued burning or discomfort with urination. MAKE SURE YOU:   Understand these instructions.  Will watch your condition.  Will get help right away if you are not doing well or get worse. Document Released: 04/13/2005 Document Revised: 01/03/2012 Document Reviewed: 08/12/2011 [facility] Patient Information 2015 [place], [place]. This information is not intended to replace advice given to you by your health care provider. Make sure you discuss any questions you have with your health care provider.

## 2014-05-07 NOTE — Assessment & Plan Note (Signed)
Urinalysis revealed small leukocytes and moderate blood. Given urinalysis and history, will treat empirically for UTI with Keflex. Sending urine for culture as well.

## 2014-05-07 NOTE — Progress Notes (Signed)
   Subjective:    Patient ID: Alicia Gould, female    DOB: August 03, 1965, 48 y.o.   MRN: [id number]  HPI 48 year old female presents for same day appointment with concerns for UTI.  1) Dysuria  Patient reports that she's been experiencing dysuria for approximately 5 days.  Over this period of time she's noticed some blood in urine.  She's also noticed increased urinary frequency.  She states she's tried "flushing" her kidneys with increased water intake is still experiencing dysuria.  No other exacerbating or relieving factors.  No recent fevers, chills.  She currently denies any back pain, flank pain, abdominal pain.  No nausea/vomiting.   Review of Systems Per HPI    Objective:   Physical Exam Filed Vitals:   05/07/14 1028  BP: 148/83  Pulse: 101  Temp: 98.8 F (37.1 C)   Exam: General: well appearing, NAD. Cardiovascular: RRR. No murmurs, rubs, or gallops. Respiratory: CTAB. No rales, rhonchi, or wheeze. Abdomen: soft, nontender, nondistended. No palpable organomegaly. No rebound or guarding.    Assessment & Plan:  See Problem List

## 2014-05-10 LAB — URINE CULTURE: Colony Count: >=100000

## 2014-05-20 ENCOUNTER — Ambulatory Visit: Payer: BC Managed Care – PPO | Admitting: Family Medicine

## 2015-04-01 ENCOUNTER — Encounter: Payer: Self-pay | Admitting: Internal Medicine

## 2015-04-01 ENCOUNTER — Ambulatory Visit (INDEPENDENT_AMBULATORY_CARE_PROVIDER_SITE_OTHER): Payer: BLUE CROSS/BLUE SHIELD | Admitting: Family Medicine

## 2015-04-01 ENCOUNTER — Other Ambulatory Visit (HOSPITAL_COMMUNITY)
Admission: RE | Admit: 2015-04-01 | Discharge: 2015-04-01 | Disposition: A | Discharge status: Home / Self Care | Payer: BLUE CROSS/BLUE SHIELD | Source: Ambulatory Visit | Attending: Family Medicine | Admitting: Family Medicine

## 2015-04-01 ENCOUNTER — Encounter: Payer: Self-pay | Admitting: Family Medicine

## 2015-04-01 VITALS — BP 130/80 | HR 83 | Temp 98.9°F | Ht 68.0 in | Wt 179.2 lb

## 2015-04-01 DIAGNOSIS — Z01419 Encounter for gynecological examination (general) (routine) without abnormal findings: Secondary | ICD-10-CM | POA: Insufficient documentation

## 2015-04-01 DIAGNOSIS — Z0000 Encounter for general adult medical examination without abnormal findings: Secondary | ICD-10-CM

## 2015-04-01 DIAGNOSIS — Z23 Encounter for immunization: Secondary | ICD-10-CM | POA: Diagnosis not present

## 2015-04-01 DIAGNOSIS — Z1151 Encounter for screening for human papillomavirus (HPV): Secondary | ICD-10-CM | POA: Diagnosis present

## 2015-04-01 DIAGNOSIS — F17200 Nicotine dependence, unspecified, uncomplicated: Secondary | ICD-10-CM | POA: Insufficient documentation

## 2015-04-01 DIAGNOSIS — Z124 Encounter for screening for malignant neoplasm of cervix: Secondary | ICD-10-CM

## 2015-04-01 DIAGNOSIS — Z1211 Encounter for screening for malignant neoplasm of colon: Secondary | ICD-10-CM

## 2015-04-01 DIAGNOSIS — Z1239 Encounter for other screening for malignant neoplasm of breast: Secondary | ICD-10-CM

## 2015-04-01 DIAGNOSIS — Z720 Tobacco use: Secondary | ICD-10-CM

## 2015-04-01 LAB — TSH: TSH: 1.194 u[IU]/mL (ref 0.350–4.500)

## 2015-04-01 LAB — CBC
HCT: 42.2 % (ref 36.0–46.0)
HEMOGLOBIN: 14.1 g/dL (ref 12.0–15.0)
MCH: 29.1 pg (ref 26.0–34.0)
MCHC: 33.4 g/dL (ref 30.0–36.0)
MCV: 87.2 fL (ref 78.0–100.0)
MPV: 11.1 fL (ref 8.6–12.4)
PLATELETS: 281 10*3/uL (ref 150–400)
RBC: 4.84 MIL/uL (ref 3.87–5.11)
RDW: 14 % (ref 11.5–15.5)
WBC: 5.6 10*3/uL (ref 4.0–10.5)

## 2015-04-01 LAB — COMPREHENSIVE METABOLIC PANEL
ALBUMIN: 4.4 g/dL (ref 3.6–5.1)
ALT: 10 U/L (ref 6–29)
AST: 12 U/L (ref 10–35)
Alkaline Phosphatase: 68 U/L (ref 33–130)
BUN: 9 mg/dL (ref 7–25)
CHLORIDE: 107 mmol/L (ref 98–110)
CO2: 26 mmol/L (ref 20–31)
Calcium: 9.7 mg/dL (ref 8.6–10.4)
Creat: 0.82 mg/dL (ref 0.50–1.05)
Glucose, Bld: 98 mg/dL (ref 65–99)
POTASSIUM: 3.6 mmol/L (ref 3.5–5.3)
Sodium: 143 mmol/L (ref 135–146)
TOTAL PROTEIN: 7.1 g/dL (ref 6.1–8.1)
Total Bilirubin: 0.5 mg/dL (ref 0.2–1.2)

## 2015-04-01 LAB — LIPID PANEL
CHOL/HDL RATIO: 4.4 ratio (ref <=5.0)
CHOLESTEROL: 192 mg/dL (ref 125–200)
HDL: 44 mg/dL — ABNORMAL LOW (ref >=46)
LDL CALC: 135 mg/dL — AB (ref <130)
TRIGLYCERIDES: 66 mg/dL (ref <150)
VLDL: 13 mg/dL (ref <30)

## 2015-04-01 NOTE — Patient Instructions (Signed)
Preventive Care for Adults A healthy lifestyle and preventive care can promote health and wellness. Preventive health guidelines for women include the following key practices.  A routine yearly physical is a good way to check with your health care provider about your health and preventive screening. It is a chance to share any concerns and updates on your health and to receive a thorough exam.  Visit your dentist for a routine exam and preventive care every 6 months. Brush your teeth twice a day and floss once a day. Good oral hygiene prevents tooth decay and gum disease.  The frequency of eye exams is based on your age, health, family medical history, use of contact lenses, and other factors. Follow your health care provider's recommendations for frequency of eye exams.  Eat a healthy diet. Foods like vegetables, fruits, whole grains, low-fat dairy products, and lean protein foods contain the nutrients you need without too many calories. Decrease your intake of foods high in solid fats, added sugars, and salt. Eat the right amount of calories for you.Get information about a proper diet from your health care provider, if necessary.  Regular physical exercise is one of the most important things you can do for your health. Most adults should get at least 150 minutes of moderate-intensity exercise (any activity that increases your heart rate and causes you to sweat) each week. In addition, most adults need muscle-strengthening exercises on 2 or more days a week.  Maintain a healthy weight. The body mass index (BMI) is a screening tool to identify possible weight problems. It provides an estimate of body fat based on height and weight. Your health care provider can find your BMI and can help you achieve or maintain a healthy weight.For adults 20 years and older:  A BMI below 18.5 is considered underweight.  A BMI of 18.5 to 24.9 is normal.  A BMI of 25 to 29.9 is considered overweight.  A BMI of  30 and above is considered obese.  Maintain normal blood lipids and cholesterol levels by exercising and minimizing your intake of saturated fat. Eat a balanced diet with plenty of fruit and vegetables. Blood tests for lipids and cholesterol should begin at age 76 and be repeated every 5 years. If your lipid or cholesterol levels are high, you are over 50, or you are at high risk for heart disease, you may need your cholesterol levels checked more frequently.Ongoing high lipid and cholesterol levels should be treated with medicines if diet and exercise are not working.  If you smoke, find out from your health care provider how to quit. If you do not use tobacco, do not start.  Lung cancer screening is recommended for adults aged 22-80 years who are at high risk for developing lung cancer because of a history of smoking. A yearly low-dose CT scan of the lungs is recommended for people who have at least a 30-pack-year history of smoking and are a current smoker or have quit within the past 15 years. A pack year of smoking is smoking an average of 1 pack of cigarettes a day for 1 year (for example: 1 pack a day for 30 years or 2 packs a day for 15 years). Yearly screening should continue until the smoker has stopped smoking for at least 15 years. Yearly screening should be stopped for people who develop a health problem that would prevent them from having lung cancer treatment.  If you are pregnant, do not drink alcohol. If you are breastfeeding,  be very cautious about drinking alcohol. If you are not pregnant and choose to drink alcohol, do not have more than 1 drink per day. One drink is considered to be 12 ounces (355 mL) of beer, 5 ounces (148 mL) of wine, or 1.5 ounces (44 mL) of liquor.  Avoid use of street drugs. Do not share needles with anyone. Ask for help if you need support or instructions about stopping the use of drugs.  High blood pressure causes heart disease and increases the risk of  stroke. Your blood pressure should be checked at least every 1 to 2 years. Ongoing high blood pressure should be treated with medicines if weight loss and exercise do not work.  If you are 75-52 years old, ask your health care provider if you should take aspirin to prevent strokes.  Diabetes screening involves taking a blood sample to check your fasting blood sugar level. This should be done once every 3 years, after age 15, if you are within normal weight and without risk factors for diabetes. Testing should be considered at a younger age or be carried out more frequently if you are overweight and have at least 1 risk factor for diabetes.  Breast cancer screening is essential preventive care for women. You should practice "breast self-awareness." This means understanding the normal appearance and feel of your breasts and may include breast self-examination. Any changes detected, no matter how small, should be reported to a health care provider. Women in their 58s and 30s should have a clinical breast exam (CBE) by a health care provider as part of a regular health exam every 1 to 3 years. After age 16, women should have a CBE every year. Starting at age 53, women should consider having a mammogram (breast X-ray test) every year. Women who have a family history of breast cancer should talk to their health care provider about genetic screening. Women at a high risk of breast cancer should talk to their health care providers about having an MRI and a mammogram every year.  Breast cancer gene (BRCA)-related cancer risk assessment is recommended for women who have family members with BRCA-related cancers. BRCA-related cancers include breast, ovarian, tubal, and peritoneal cancers. Having family members with these cancers may be associated with an increased risk for harmful changes (mutations) in the breast cancer genes BRCA1 and BRCA2. Results of the assessment will determine the need for genetic counseling and  BRCA1 and BRCA2 testing.  Routine pelvic exams to screen for cancer are no longer recommended for nonpregnant women who are considered low risk for cancer of the pelvic organs (ovaries, uterus, and vagina) and who do not have symptoms. Ask your health care provider if a screening pelvic exam is right for you.  If you have had past treatment for cervical cancer or a condition that could lead to cancer, you need Pap tests and screening for cancer for at least 20 years after your treatment. If Pap tests have been discontinued, your risk factors (such as having a new sexual partner) need to be reassessed to determine if screening should be resumed. Some women have medical problems that increase the chance of getting cervical cancer. In these cases, your health care provider may recommend more frequent screening and Pap tests.  The HPV test is an additional test that may be used for cervical cancer screening. The HPV test looks for the virus that can cause the cell changes on the cervix. The cells collected during the Pap test can be  tested for HPV. The HPV test could be used to screen women aged 30 years and older, and should be used in women of any age who have unclear Pap test results. After the age of 30, women should have HPV testing at the same frequency as a Pap test.  Colorectal cancer can be detected and often prevented. Most routine colorectal cancer screening begins at the age of 50 years and continues through age 75 years. However, your health care provider may recommend screening at an earlier age if you have risk factors for colon cancer. On a yearly basis, your health care provider may provide home test kits to check for hidden blood in the stool. Use of a small camera at the end of a tube, to directly examine the colon (sigmoidoscopy or colonoscopy), can detect the earliest forms of colorectal cancer. Talk to your health care provider about this at age 50, when routine screening begins. Direct  exam of the colon should be repeated every 5-10 years through age 75 years, unless early forms of pre-cancerous polyps or small growths are found.  People who are at an increased risk for hepatitis B should be screened for this virus. You are considered at high risk for hepatitis B if:  You were born in a country where hepatitis B occurs often. Talk with your health care provider about which countries are considered high risk.  Your parents were born in a high-risk country and you have not received a shot to protect against hepatitis B (hepatitis B vaccine).  You have HIV or AIDS.  You use needles to inject street drugs.  You live with, or have sex with, someone who has hepatitis B.  You get hemodialysis treatment.  You take certain medicines for conditions like cancer, organ transplantation, and autoimmune conditions.  Hepatitis C blood testing is recommended for all people born from 1945 through 1965 and any individual with known risks for hepatitis C.  Practice safe sex. Use condoms and avoid high-risk sexual practices to reduce the spread of sexually transmitted infections (STIs). STIs include gonorrhea, chlamydia, syphilis, trichomonas, herpes, HPV, and human immunodeficiency virus (HIV). Herpes, HIV, and HPV are viral illnesses that have no cure. They can result in disability, cancer, and death.  You should be screened for sexually transmitted illnesses (STIs) including gonorrhea and chlamydia if:  You are sexually active and are younger than 24 years.  You are older than 24 years and your health care provider tells you that you are at risk for this type of infection.  Your sexual activity has changed since you were last screened and you are at an increased risk for chlamydia or gonorrhea. Ask your health care provider if you are at risk.  If you are at risk of being infected with HIV, it is recommended that you take a prescription medicine daily to prevent HIV infection. This is  called preexposure prophylaxis (PrEP). You are considered at risk if:  You are a heterosexual woman, are sexually active, and are at increased risk for HIV infection.  You take drugs by injection.  You are sexually active with a partner who has HIV.  Talk with your health care provider about whether you are at high risk of being infected with HIV. If you choose to begin PrEP, you should first be tested for HIV. You should then be tested every 3 months for as long as you are taking PrEP.  Osteoporosis is a disease in which the bones lose minerals and strength   with aging. This can result in serious bone fractures or breaks. The risk of osteoporosis can be identified using a bone density scan. Women ages 65 years and over and women at risk for fractures or osteoporosis should discuss screening with their health care providers. Ask your health care provider whether you should take a calcium supplement or vitamin D to reduce the rate of osteoporosis.  Menopause can be associated with physical symptoms and risks. Hormone replacement therapy is available to decrease symptoms and risks. You should talk to your health care provider about whether hormone replacement therapy is right for you.  Use sunscreen. Apply sunscreen liberally and repeatedly throughout the day. You should seek shade when your shadow is shorter than you. Protect yourself by wearing long sleeves, pants, a wide-brimmed hat, and sunglasses year round, whenever you are outdoors.  Once a month, do a whole body skin exam, using a mirror to look at the skin on your back. Tell your health care provider of new moles, moles that have irregular borders, moles that are larger than a pencil eraser, or moles that have changed in shape or color.  Stay current with required vaccines (immunizations).  Influenza vaccine. All adults should be immunized every year.  Tetanus, diphtheria, and acellular pertussis (Td, Tdap) vaccine. Pregnant women should  receive 1 dose of Tdap vaccine during each pregnancy. The dose should be obtained regardless of the length of time since the last dose. Immunization is preferred during the 27th-36th week of gestation. An adult who has not previously received Tdap or who does not know her vaccine status should receive 1 dose of Tdap. This initial dose should be followed by tetanus and diphtheria toxoids (Td) booster doses every 10 years. Adults with an unknown or incomplete history of completing a 3-dose immunization series with Td-containing vaccines should begin or complete a primary immunization series including a Tdap dose. Adults should receive a Td booster every 10 years.  Varicella vaccine. An adult without evidence of immunity to varicella should receive 2 doses or a second dose if she has previously received 1 dose. Pregnant females who do not have evidence of immunity should receive the first dose after pregnancy. This first dose should be obtained before leaving the health care facility. The second dose should be obtained 4-8 weeks after the first dose.  Human papillomavirus (HPV) vaccine. Females aged 13-26 years who have not received the vaccine previously should obtain the 3-dose series. The vaccine is not recommended for use in pregnant females. However, pregnancy testing is not needed before receiving a dose. If a female is found to be pregnant after receiving a dose, no treatment is needed. In that case, the remaining doses should be delayed until after the pregnancy. Immunization is recommended for any person with an immunocompromised condition through the age of 26 years if she did not get any or all doses earlier. During the 3-dose series, the second dose should be obtained 4-8 weeks after the first dose. The third dose should be obtained 24 weeks after the first dose and 16 weeks after the second dose.  Zoster vaccine. One dose is recommended for adults aged 60 years or older unless certain conditions are  present.  Measles, mumps, and rubella (MMR) vaccine. Adults born before 1957 generally are considered immune to measles and mumps. Adults born in 1957 or later should have 1 or more doses of MMR vaccine unless there is a contraindication to the vaccine or there is laboratory evidence of immunity to   each of the three diseases. A routine second dose of MMR vaccine should be obtained at least 28 days after the first dose for students attending postsecondary schools, health care workers, or international travelers. People who received inactivated measles vaccine or an unknown type of measles vaccine during 1963-1967 should receive 2 doses of MMR vaccine. People who received inactivated mumps vaccine or an unknown type of mumps vaccine before 1979 and are at high risk for mumps infection should consider immunization with 2 doses of MMR vaccine. For females of childbearing age, rubella immunity should be determined. If there is no evidence of immunity, females who are not pregnant should be vaccinated. If there is no evidence of immunity, females who are pregnant should delay immunization until after pregnancy. Unvaccinated health care workers born before 1957 who lack laboratory evidence of measles, mumps, or rubella immunity or laboratory confirmation of disease should consider measles and mumps immunization with 2 doses of MMR vaccine or rubella immunization with 1 dose of MMR vaccine.  Pneumococcal 13-valent conjugate (PCV13) vaccine. When indicated, a person who is uncertain of her immunization history and has no record of immunization should receive the PCV13 vaccine. An adult aged 19 years or older who has certain medical conditions and has not been previously immunized should receive 1 dose of PCV13 vaccine. This PCV13 should be followed with a dose of pneumococcal polysaccharide (PPSV23) vaccine. The PPSV23 vaccine dose should be obtained at least 8 weeks after the dose of PCV13 vaccine. An adult aged 19  years or older who has certain medical conditions and previously received 1 or more doses of PPSV23 vaccine should receive 1 dose of PCV13. The PCV13 vaccine dose should be obtained 1 or more years after the last PPSV23 vaccine dose.  Pneumococcal polysaccharide (PPSV23) vaccine. When PCV13 is also indicated, PCV13 should be obtained first. All adults aged 65 years and older should be immunized. An adult younger than age 65 years who has certain medical conditions should be immunized. Any person who resides in a nursing home or long-term care facility should be immunized. An adult smoker should be immunized. People with an immunocompromised condition and certain other conditions should receive both PCV13 and PPSV23 vaccines. People with human immunodeficiency virus (HIV) infection should be immunized as soon as possible after diagnosis. Immunization during chemotherapy or radiation therapy should be avoided. Routine use of PPSV23 vaccine is not recommended for American Indians, [place] Natives, or people younger than 65 years unless there are medical conditions that require PPSV23 vaccine. When indicated, people who have unknown immunization and have no record of immunization should receive PPSV23 vaccine. One-time revaccination 5 years after the first dose of PPSV23 is recommended for people aged 19-64 years who have chronic kidney failure, nephrotic syndrome, asplenia, or immunocompromised conditions. People who received 1-2 doses of PPSV23 before age 65 years should receive another dose of PPSV23 vaccine at age 65 years or later if at least 5 years have passed since the previous dose. Doses of PPSV23 are not needed for people immunized with PPSV23 at or after age 65 years.  Meningococcal vaccine. Adults with asplenia or persistent complement component deficiencies should receive 2 doses of quadrivalent meningococcal conjugate (MenACWY-D) vaccine. The doses should be obtained at least 2 months apart.  Microbiologists working with certain meningococcal bacteria, military recruits, people at risk during an outbreak, and people who travel to or live in countries with a high rate of meningitis should be immunized. A first-year college student up through age   21 years who is living in a residence hall should receive a dose if she did not receive a dose on or after her 16th birthday. Adults who have certain high-risk conditions should receive one or more doses of vaccine.  Hepatitis A vaccine. Adults who wish to be protected from this disease, have certain high-risk conditions, work with hepatitis A-infected animals, work in hepatitis A research labs, or travel to or work in countries with a high rate of hepatitis A should be immunized. Adults who were previously unvaccinated and who anticipate close contact with an international adoptee during the first 60 days after arrival in the [place] from a country with a high rate of hepatitis A should be immunized.  Hepatitis B vaccine. Adults who wish to be protected from this disease, have certain high-risk conditions, may be exposed to blood or other infectious body fluids, are household contacts or sex partners of hepatitis B positive people, are clients or workers in certain care facilities, or travel to or work in countries with a high rate of hepatitis B should be immunized.  Haemophilus influenzae type b (Hib) vaccine. A previously unvaccinated person with asplenia or sickle cell disease or having a scheduled splenectomy should receive 1 dose of Hib vaccine. Regardless of previous immunization, a recipient of a hematopoietic stem cell transplant should receive a 3-dose series 6-12 months after her successful transplant. Hib vaccine is not recommended for adults with HIV infection. Preventive Services / Frequency Ages 64 to 68 years  Blood pressure check.** / Every 1 to 2 years.  Lipid and cholesterol check.** / Every 5 years beginning at age  22.  Clinical breast exam.** / Every 3 years for women in their 88s and 53s.  BRCA-related cancer risk assessment.** / For women who have family members with a BRCA-related cancer (breast, ovarian, tubal, or peritoneal cancers).  Pap test.** / Every 2 years from ages 90 through 51. Every 3 years starting at age 21 through age 56 or 3 with a history of 3 consecutive normal Pap tests.  HPV screening.** / Every 3 years from ages 24 through ages 1 to 46 with a history of 3 consecutive normal Pap tests.  Hepatitis C blood test.** / For any individual with known risks for hepatitis C.  Skin self-exam. / Monthly.  Influenza vaccine. / Every year.  Tetanus, diphtheria, and acellular pertussis (Tdap, Td) vaccine.** / Consult your health care provider. Pregnant women should receive 1 dose of Tdap vaccine during each pregnancy. 1 dose of Td every 10 years.  Varicella vaccine.** / Consult your health care provider. Pregnant females who do not have evidence of immunity should receive the first dose after pregnancy.  HPV vaccine. / 3 doses over 6 months, if 72 and younger. The vaccine is not recommended for use in pregnant females. However, pregnancy testing is not needed before receiving a dose.  Measles, mumps, rubella (MMR) vaccine.** / You need at least 1 dose of MMR if you were born in 1957 or later. You may also need a 2nd dose. For females of childbearing age, rubella immunity should be determined. If there is no evidence of immunity, females who are not pregnant should be vaccinated. If there is no evidence of immunity, females who are pregnant should delay immunization until after pregnancy.  Pneumococcal 13-valent conjugate (PCV13) vaccine.** / Consult your health care provider.  Pneumococcal polysaccharide (PPSV23) vaccine.** / 1 to 2 doses if you smoke cigarettes or if you have certain conditions.  Meningococcal vaccine.** /  1 dose if you are age 19 to 21 years and a first-year college  student living in a residence hall, or have one of several medical conditions, you need to get vaccinated against meningococcal disease. You may also need additional booster doses.  Hepatitis A vaccine.** / Consult your health care provider.  Hepatitis B vaccine.** / Consult your health care provider.  Haemophilus influenzae type b (Hib) vaccine.** / Consult your health care provider. Ages 40 to 64 years  Blood pressure check.** / Every 1 to 2 years.  Lipid and cholesterol check.** / Every 5 years beginning at age 20 years.  Lung cancer screening. / Every year if you are aged 55-80 years and have a 30-pack-year history of smoking and currently smoke or have quit within the past 15 years. Yearly screening is stopped once you have quit smoking for at least 15 years or develop a health problem that would prevent you from having lung cancer treatment.  Clinical breast exam.** / Every year after age 40 years.  BRCA-related cancer risk assessment.** / For women who have family members with a BRCA-related cancer (breast, ovarian, tubal, or peritoneal cancers).  Mammogram.** / Every year beginning at age 40 years and continuing for as long as you are in good health. Consult with your health care provider.  Pap test.** / Every 3 years starting at age 30 years through age 65 or 70 years with a history of 3 consecutive normal Pap tests.  HPV screening.** / Every 3 years from ages 30 years through ages 65 to 70 years with a history of 3 consecutive normal Pap tests.  Fecal occult blood test (FOBT) of stool. / Every year beginning at age 50 years and continuing until age 75 years. You may not need to do this test if you get a colonoscopy every 10 years.  Flexible sigmoidoscopy or colonoscopy.** / Every 5 years for a flexible sigmoidoscopy or every 10 years for a colonoscopy beginning at age 50 years and continuing until age 75 years.  Hepatitis C blood test.** / For all people born from 1945 through  1965 and any individual with known risks for hepatitis C.  Skin self-exam. / Monthly.  Influenza vaccine. / Every year.  Tetanus, diphtheria, and acellular pertussis (Tdap/Td) vaccine.** / Consult your health care provider. Pregnant women should receive 1 dose of Tdap vaccine during each pregnancy. 1 dose of Td every 10 years.  Varicella vaccine.** / Consult your health care provider. Pregnant females who do not have evidence of immunity should receive the first dose after pregnancy.  Zoster vaccine.** / 1 dose for adults aged 60 years or older.  Measles, mumps, rubella (MMR) vaccine.** / You need at least 1 dose of MMR if you were born in 1957 or later. You may also need a 2nd dose. For females of childbearing age, rubella immunity should be determined. If there is no evidence of immunity, females who are not pregnant should be vaccinated. If there is no evidence of immunity, females who are pregnant should delay immunization until after pregnancy.  Pneumococcal 13-valent conjugate (PCV13) vaccine.** / Consult your health care provider.  Pneumococcal polysaccharide (PPSV23) vaccine.** / 1 to 2 doses if you smoke cigarettes or if you have certain conditions.  Meningococcal vaccine.** / Consult your health care provider.  Hepatitis A vaccine.** / Consult your health care provider.  Hepatitis B vaccine.** / Consult your health care provider.  Haemophilus influenzae type b (Hib) vaccine.** / Consult your health care provider. Ages 65   years and over  Blood pressure check.** / Every 1 to 2 years.  Lipid and cholesterol check.** / Every 5 years beginning at age 58 years.  Lung cancer screening. / Every year if you are aged 8-80 years and have a 30-pack-year history of smoking and currently smoke or have quit within the past 15 years. Yearly screening is stopped once you have quit smoking for at least 15 years or develop a health problem that would prevent you from having lung cancer  treatment.  Clinical breast exam.** / Every year after age 77 years.  BRCA-related cancer risk assessment.** / For women who have family members with a BRCA-related cancer (breast, ovarian, tubal, or peritoneal cancers).  Mammogram.** / Every year beginning at age 40 years and continuing for as long as you are in good health. Consult with your health care provider.  Pap test.** / Every 3 years starting at age 33 years through age 67 or 96 years with 3 consecutive normal Pap tests. Testing can be stopped between 65 and 70 years with 3 consecutive normal Pap tests and no abnormal Pap or HPV tests in the past 10 years.  HPV screening.** / Every 3 years from ages 41 years through ages 6 or 77 years with a history of 3 consecutive normal Pap tests. Testing can be stopped between 65 and 70 years with 3 consecutive normal Pap tests and no abnormal Pap or HPV tests in the past 10 years.  Fecal occult blood test (FOBT) of stool. / Every year beginning at age 43 years and continuing until age 60 years. You may not need to do this test if you get a colonoscopy every 10 years.  Flexible sigmoidoscopy or colonoscopy.** / Every 5 years for a flexible sigmoidoscopy or every 10 years for a colonoscopy beginning at age 34 years and continuing until age 78 years.  Hepatitis C blood test.** / For all people born from [birth year] through 1966 and any individual with known risks for hepatitis C.  Osteoporosis screening.** / A one-time screening for women ages 96 years and over and women at risk for fractures or osteoporosis.  Skin self-exam. / Monthly.  Influenza vaccine. / Every year.  Tetanus, diphtheria, and acellular pertussis (Tdap/Td) vaccine.** / 1 dose of Td every 10 years.  Varicella vaccine.** / Consult your health care provider.  Zoster vaccine.** / 1 dose for adults aged 49 years or older.  Pneumococcal 13-valent conjugate (PCV13) vaccine.** / Consult your health care provider.  Pneumococcal  polysaccharide (PPSV23) vaccine.** / 1 dose for all adults aged 32 years and older.  Meningococcal vaccine.** / Consult your health care provider.  Hepatitis A vaccine.** / Consult your health care provider.  Hepatitis B vaccine.** / Consult your health care provider.  Haemophilus influenzae type b (Hib) vaccine.** / Consult your health care provider. ** Family history and personal history of risk and conditions may change your health care provider's recommendations. Document Released: 08/30/2001 Document Revised: 11/18/2013 Document Reviewed: 11/29/2010 [facility] Patient Information 2015 [place], [place]. This information is not intended to replace advice given to you by your health care provider. Make sure you discuss any questions you have with your health care provider. DASH Eating Plan DASH stands for "Dietary Approaches to Stop Hypertension." The DASH eating plan is a healthy eating plan that has been shown to reduce high blood pressure (hypertension). Additional health benefits may include reducing the risk of type 2 diabetes mellitus, heart disease, and stroke. The DASH eating plan may also help  with weight loss. WHAT DO I NEED TO KNOW ABOUT THE DASH EATING PLAN? For the DASH eating plan, you will follow these general guidelines:  Choose foods with a percent daily value for sodium of less than 5% (as listed on the food label).  Use salt-free seasonings or herbs instead of table salt or sea salt.  Check with your health care provider or pharmacist before using salt substitutes.  Eat lower-sodium products, often labeled as "lower sodium" or "no salt added."  Eat fresh foods.  Eat more vegetables, fruits, and low-fat dairy products.  Choose whole grains. Look for the word "whole" as the first word in the ingredient list.  Choose fish and skinless chicken or [place] more often than red meat. Limit fish, poultry, and meat to 6 oz (170 g) each day.  Limit sweets, desserts, sugars, and  sugary drinks.  Choose heart-healthy fats.  Limit cheese to 1 oz (28 g) per day.  Eat more home-cooked food and less restaurant, buffet, and fast food.  Limit fried foods.  Cook foods using methods other than frying.  Limit canned vegetables. If you do use them, rinse them well to decrease the sodium.  When eating at a restaurant, ask that your food be prepared with less salt, or no salt if possible. WHAT FOODS CAN I EAT? Seek help from a dietitian for individual calorie needs. Grains Whole grain or whole wheat bread. Brown rice. Whole grain or whole wheat pasta. Quinoa, bulgur, and whole grain cereals. Low-sodium cereals. Corn or whole wheat flour tortillas. Whole grain cornbread. Whole grain crackers. Low-sodium crackers. Vegetables Fresh or frozen vegetables (raw, steamed, roasted, or grilled). Low-sodium or reduced-sodium tomato and vegetable juices. Low-sodium or reduced-sodium tomato sauce and paste. Low-sodium or reduced-sodium canned vegetables.  Fruits All fresh, canned (in natural juice), or frozen fruits. Meat and Other Protein Products Ground beef (85% or leaner), grass-fed beef, or beef trimmed of fat. Skinless chicken or [place]. Ground chicken or [place]. Pork trimmed of fat. All fish and seafood. Eggs. Dried beans, peas, or lentils. Unsalted nuts and seeds. Unsalted canned beans. Dairy Low-fat dairy products, such as skim or 1% milk, 2% or reduced-fat cheeses, low-fat ricotta or cottage cheese, or plain low-fat yogurt. Low-sodium or reduced-sodium cheeses. Fats and Oils Tub margarines without trans fats. Light or reduced-fat mayonnaise and salad dressings (reduced sodium). Avocado. Safflower, olive, or canola oils. Natural peanut or almond butter. Other Unsalted popcorn and pretzels. The items listed above may not be a complete list of recommended foods or beverages. Contact your dietitian for more options. WHAT FOODS ARE NOT RECOMMENDED? Grains White bread. White  pasta. White rice. Refined cornbread. Bagels and croissants. Crackers that contain trans fat. Vegetables Creamed or fried vegetables. Vegetables in a cheese sauce. Regular canned vegetables. Regular canned tomato sauce and paste. Regular tomato and vegetable juices. Fruits Dried fruits. Canned fruit in light or heavy syrup. Fruit juice. Meat and Other Protein Products Fatty cuts of meat. Ribs, chicken wings, bacon, sausage, bologna, salami, chitterlings, fatback, hot dogs, bratwurst, and packaged luncheon meats. Salted nuts and seeds. Canned beans with salt. Dairy Whole or 2% milk, cream, half-and-half, and cream cheese. Whole-fat or sweetened yogurt. Full-fat cheeses or blue cheese. Nondairy creamers and whipped toppings. Processed cheese, cheese spreads, or cheese curds. Condiments Onion and garlic salt, seasoned salt, table salt, and sea salt. Canned and packaged gravies. Worcestershire sauce. Tartar sauce. Barbecue sauce. Teriyaki sauce. Soy sauce, including reduced sodium. Steak sauce. Fish sauce. Oyster sauce. Cocktail sauce.  Horseradish. Ketchup and mustard. Meat flavorings and tenderizers. Bouillon cubes. Hot sauce. Tabasco sauce. Marinades. Taco seasonings. Relishes. Fats and Oils Butter, stick margarine, lard, shortening, ghee, and bacon fat. Coconut, palm kernel, or palm oils. Regular salad dressings. Other Pickles and olives. Salted popcorn and pretzels. The items listed above may not be a complete list of foods and beverages to avoid. Contact your dietitian for more information. WHERE CAN I FIND MORE INFORMATION? National Heart, Lung, and Blood Institute: travelstabloid.com Document Released: 06/23/2011 Document Revised: 11/18/2013 Document Reviewed: 05/08/2013 [facility] Patient Information 2015 [place], [place]. This information is not intended to replace advice given to you by your health care provider. Make sure you discuss any questions you have with  your health care provider.

## 2015-04-01 NOTE — Addendum Note (Signed)
Addended by: Oletta Lamas E on: 04/01/2015 10:01 AM   Modules accepted: Orders

## 2015-04-01 NOTE — Assessment & Plan Note (Signed)
Smoking cessation discussed--encouraged.

## 2015-04-01 NOTE — Progress Notes (Signed)
Subjective:    Patient ID: Alicia Gould is a 49 y.o. female presenting with Annual Exam  on 04/01/2015  HPI: Here for CPE. Notes pulled muscle x 1 wk ago and some abdominal pain and distention with associated nausea. Has no period x 1 year.  Has hot flashes. Feels some abdominal swelling, distention. Has family history of ovarian cancer and fibroids. She is not exercising regularly.  Review of Systems  Constitutional: Negative for fever and chills.  HENT: Negative for congestion, nosebleeds and rhinorrhea.   Eyes: Negative for visual disturbance.  Respiratory: Positive for shortness of breath. Negative for chest tightness.   Cardiovascular: Negative for chest pain.  Gastrointestinal: Positive for nausea, constipation and abdominal distention. Negative for vomiting, abdominal pain, diarrhea and blood in stool.  Genitourinary: Negative for dysuria, frequency, vaginal bleeding and menstrual problem.  Musculoskeletal: Positive for myalgias (x 1 wk, pull of thoracic muscle). Negative for arthralgias.  Skin: Negative for rash.  Neurological: Negative for dizziness and headaches.  Psychiatric/Behavioral: Negative for behavioral problems, sleep disturbance and dysphoric mood.  All other systems reviewed and are negative.     Objective:    BP 147/81 mmHg  Pulse 83  Temp(Src) 98.9 F (37.2 C) (Oral)  Ht 5\' 8"  (1.727 m)  Wt 179 lb 4 oz (81.307 kg)  BMI 27.26 kg/m2  LMP 10/16/2012 Physical Exam  Constitutional: She is oriented to person, place, and time. She appears well-developed and well-nourished. No distress.  HENT:  Head: Normocephalic and atraumatic.  Eyes: Pupils are equal, round, and reactive to light. No scleral icterus.  Neck: Normal range of motion. Neck supple. No thyromegaly present.  Cardiovascular: Normal rate, regular rhythm and intact distal pulses.   Pulmonary/Chest: Effort normal and breath sounds normal. Right breast exhibits no inverted nipple and no mass. Left  breast exhibits no inverted nipple and no mass. Breasts are symmetrical.  Abdominal: Soft. She exhibits no distension and no mass. There is no tenderness.  Genitourinary:  NEFG, BUS is WNL, vagina is pink and ruggated, cervix is nulliparous and without lesion, uterus is 8 wk size and firm, non-tender, no adnexal mass or tenderness.   Lymphadenopathy:    She has no cervical adenopathy.  Neurological: She is alert and oriented to person, place, and time.  Skin: Skin is warm and dry.  Psychiatric: She has a normal mood and affect.        Assessment & Plan:   Problem List Items Addressed This Visit      Unprioritized   Tobacco use disorder    Smoking cessation discussed--encouraged.       Other Visit Diagnoses    Routine general medical examination at a health care facility    -  Primary    Relevant Orders    Hepatitis C antibody    CBC    Comprehensive metabolic panel    Lipid panel    TSH    Screening for cervical cancer        Relevant Orders    Cytology - PAP    Screening for breast cancer        Relevant Orders    MM DIGITAL SCREENING BILATERAL    Screen for colon cancer        Relevant Orders    Ambulatory referral to Gastroenterology      Mild elevation of BP--normal on re-check. Begin DASH diet.  Return if symptoms worsen or fail to improve.  Aissata Wilmore S 04/01/2015 9:10 AM

## 2015-04-02 LAB — CYTOLOGY - PAP

## 2015-04-02 LAB — HEPATITIS C ANTIBODY: HCV AB: NEGATIVE

## 2015-04-08 ENCOUNTER — Ambulatory Visit
Admission: RE | Admit: 2015-04-08 | Discharge: 2015-04-08 | Disposition: A | Discharge status: Home / Self Care | Payer: BLUE CROSS/BLUE SHIELD | Source: Ambulatory Visit | Attending: Family Medicine | Admitting: Family Medicine

## 2015-04-08 DIAGNOSIS — Z1239 Encounter for other screening for malignant neoplasm of breast: Secondary | ICD-10-CM

## 2015-04-22 ENCOUNTER — Ambulatory Visit (INDEPENDENT_AMBULATORY_CARE_PROVIDER_SITE_OTHER): Payer: BLUE CROSS/BLUE SHIELD | Admitting: Family Medicine

## 2015-04-22 ENCOUNTER — Encounter: Payer: Self-pay | Admitting: Family Medicine

## 2015-04-22 VITALS — BP 137/80 | HR 78 | Temp 98.6°F | Ht 67.0 in | Wt 174.3 lb

## 2015-04-22 DIAGNOSIS — R1013 Epigastric pain: Secondary | ICD-10-CM | POA: Diagnosis not present

## 2015-04-22 LAB — POCT H PYLORI SCREEN: H Pylori Screen, POC: POSITIVE

## 2015-04-22 MED ORDER — ESOMEPRAZOLE MAGNESIUM 20 MG PO TBEC: 40.0000 mg | DELAYED_RELEASE_TABLET | Freq: Every day | ORAL | Status: DC

## 2015-04-22 NOTE — Patient Instructions (Signed)
It was great seeing you today.   1. I will check for H pylori today; I will call with the results.  2. You can take Nexium 40 mg once daily for the next 2-4 weeks. You can stop this if your symptoms resolve.    Please bring all your medications to every doctors visit  Sign up for My Chart to have easy access to your labs results, and communication with your Primary care physician.  Next Appointment  Please make an appointment with Dr Gerarda Fraction in 3-4 weeks to f/u for your stomach pain    If you have any questions or concerns before then, please call the clinic at [phone].  Take Care,   Dr Phill Myron

## 2015-04-22 NOTE — Progress Notes (Signed)
   Subjective:    Patient ID: Alicia Gould, female    DOB: 09-26-1965, 49 y.o.   MRN: [id number]  Seen for Same day visit for   CC: LUQ / epigastric pain  She reports left upper quadrant /epigastric pain for the last 3 days.  Pain is burning/pulling  Sensation.  Pain occurs intermittently, several times a day.  Pain seems to be made worse with movement; initially began after lifting heavy boxes several days ago.  He denies any heartburn/reflux/bad taste in mouth.  She notes history of H. pylori and request retest for this today.  She has taken one Nexium which helped her pain somewhat.  She reports quitting smoking 7 days ago.  Does not some constipation and bloating sensation.  Denies melanoma or hematochezia.  Denies NSAID use.   Review of Systems   See HPI for ROS. Objective:  BP 137/80 mmHg  Pulse 78  Temp(Src) 98.6 F (37 C) (Oral)  Ht 5\' 7"  (1.702 m)  Wt 174 lb 4.8 oz (79.062 kg)  BMI 27.29 kg/m2  LMP 10/16/2012  General: NAD Cardiac: RRR, normal heart sounds, no murmurs Respiratory: CTAB, normal effort Abdomen: soft, mild LUQ / epigastric tenderness, nondistended Bowel sounds present    Assessment & Plan:   Abdominal pain, epigastric Patient reports pain similar to previous H pylori infection. Denies symptoms of GI bleed - Will check H pylori; will call with results and treat as indicated.  - Rx for Nexium while awaiting results

## 2015-04-22 NOTE — Assessment & Plan Note (Signed)
Patient reports pain similar to previous H pylori infection. Denies symptoms of GI bleed - Will check H pylori; will call with results and treat as indicated.  - Rx for Nexium while awaiting results

## 2015-04-23 ENCOUNTER — Telehealth: Payer: Self-pay | Admitting: Family Medicine

## 2015-04-23 MED ORDER — CLARITHROMYCIN 500 MG PO TABS: 500.0000 mg | ORAL_TABLET | Freq: Two times a day (BID) | ORAL | Status: DC

## 2015-04-23 MED ORDER — AMOXICILLIN 500 MG PO TABS: 1000.0000 mg | ORAL_TABLET | Freq: Two times a day (BID) | ORAL | Status: DC

## 2015-04-23 NOTE — Telephone Encounter (Signed)
LM for patient to call back. Jazmin Hartsell,CMA  

## 2015-04-23 NOTE — Telephone Encounter (Signed)
Attempted to call patient, but no answer.  If she calls back,  please inform her that her H. pylori test was positive and I have called in antibodies.  She should take amoxicillin 1000 mg twice a day and clarithromycin 500 mg twice a day, both for 2 weeks.  She should continue the Nexium as prescribed for 2 weeks.  She should be seen by her primary care doctor, Dr. Gerarda Fraction if her symptoms have not resolved at the end of treatment.

## 2015-04-24 ENCOUNTER — Telehealth: Payer: Self-pay | Admitting: Obstetrics and Gynecology

## 2015-04-24 NOTE — Telephone Encounter (Signed)
Pt is returning Jazmin's call jw

## 2015-04-24 NOTE — Telephone Encounter (Signed)
Please refer to previous telephone msg about this. [name], CMA.

## 2015-04-24 NOTE — Telephone Encounter (Signed)
Advised pt as directed below and verbalized understanding. She stated that Nexium was not covered by her insurance and that it was [place] cost her $400 and would like to know if something else can be sent in that will be covered by insurance. Anne Sebring, CMA.

## 2015-04-29 ENCOUNTER — Other Ambulatory Visit: Payer: Self-pay | Admitting: Family Medicine

## 2015-04-29 MED ORDER — OMEPRAZOLE 20 MG PO CPDR: 20.0000 mg | DELAYED_RELEASE_CAPSULE | Freq: Two times a day (BID) | ORAL | Status: DC

## 2015-04-29 NOTE — Telephone Encounter (Signed)
Please let her know prilosec was sent in to substitute for Nexium

## 2015-04-29 NOTE — Telephone Encounter (Signed)
Spoke with patient and she voiced understanding. Murphy Bundick,CMA  

## 2015-05-07 ENCOUNTER — Ambulatory Visit (AMBULATORY_SURGERY_CENTER): Payer: Self-pay | Admitting: *Deleted

## 2015-05-07 VITALS — Ht 68.0 in | Wt 176.0 lb

## 2015-05-07 DIAGNOSIS — Z1211 Encounter for screening for malignant neoplasm of colon: Secondary | ICD-10-CM

## 2015-05-07 MED ORDER — NA SULFATE-K SULFATE-MG SULF 17.5-3.13-1.6 GM/177ML PO SOLN
1.0000 | Freq: Once | ORAL | Status: DC
Start: 2015-05-07 — End: 2015-06-04

## 2015-05-07 NOTE — Progress Notes (Signed)
No egg or soy allergy. No anesthesia problems.  No home O2.  No diet meds.  

## 2015-05-07 NOTE — Addendum Note (Signed)
Addended by: [place] Bailiff D on: 05/07/2015 01:28 PM   Modules accepted: Orders

## 2015-06-04 ENCOUNTER — Encounter: Payer: Self-pay | Admitting: Internal Medicine

## 2015-06-04 ENCOUNTER — Ambulatory Visit (AMBULATORY_SURGERY_CENTER): Payer: BLUE CROSS/BLUE SHIELD | Admitting: Internal Medicine

## 2015-06-04 VITALS — BP 134/74 | HR 67 | Temp 96.9°F | Resp 16 | Ht 68.0 in | Wt 176.0 lb

## 2015-06-04 DIAGNOSIS — K635 Polyp of colon: Secondary | ICD-10-CM

## 2015-06-04 DIAGNOSIS — Z1211 Encounter for screening for malignant neoplasm of colon: Secondary | ICD-10-CM | POA: Diagnosis not present

## 2015-06-04 DIAGNOSIS — D127 Benign neoplasm of rectosigmoid junction: Secondary | ICD-10-CM

## 2015-06-04 MED ORDER — SODIUM CHLORIDE 0.9 % IV SOLN: 500.0000 mL | INTRAVENOUS | Status: DC

## 2015-06-04 NOTE — Progress Notes (Signed)
Called to room to assist during endoscopic procedure.  Patient ID and intended procedure confirmed with present staff. Received instructions for my participation in the procedure from the performing physician.  

## 2015-06-04 NOTE — Patient Instructions (Signed)
YOU HAD AN ENDOSCOPIC PROCEDURE TODAY AT THE [facility]:   Refer to the procedure report that was given to you for any specific questions about what was found during the examination.  If the procedure report does not answer your questions, please call your gastroenterologist to clarify.  If you requested that your care partner not be given the details of your procedure findings, then the procedure report has been included in a sealed envelope for you to review at your convenience later.  YOU SHOULD EXPECT: Some feelings of bloating in the abdomen. Passage of more gas than usual.  Walking can help get rid of the air that was put into your GI tract during the procedure and reduce the bloating. If you had a lower endoscopy (such as a colonoscopy or flexible sigmoidoscopy) you may notice spotting of blood in your stool or on the toilet paper. If you underwent a bowel prep for your procedure, you may not have a normal bowel movement for a few days.  Please Note:  You might notice some irritation and congestion in your nose or some drainage.  This is from the oxygen used during your procedure.  There is no need for concern and it should clear up in a day or so.  SYMPTOMS TO REPORT IMMEDIATELY:   Following lower endoscopy (colonoscopy or flexible sigmoidoscopy):  Excessive amounts of blood in the stool  Significant tenderness or worsening of abdominal pains  Swelling of the abdomen that is new, acute  Fever of 100F or higher   For urgent or emergent issues, a gastroenterologist can be reached at any hour by calling [phone].   DIET: Your first meal following the procedure should be a small meal and then it is ok to progress to your normal diet. Heavy or fried foods are harder to digest and may make you feel nauseous or bloated.  Likewise, meals heavy in dairy and vegetables can increase bloating.  Drink plenty of fluids but you should avoid alcoholic beverages for 24  hours.  ACTIVITY:  You should plan to take it easy for the rest of today and you should NOT DRIVE or use heavy machinery until tomorrow (because of the sedation medicines used during the test).    FOLLOW UP: Our staff will call the number listed on your records the next business day following your procedure to check on you and address any questions or concerns that you may have regarding the information given to you following your procedure. If we do not reach you, we will leave a message.  However, if you are feeling well and you are not experiencing any problems, there is no need to return our call.  We will assume that you have returned to your regular daily activities without incident.  If any biopsies were taken you will be contacted by phone or by letter within the next 1-3 weeks.  Please call us at [phone] if you have not heard about the biopsies in 3 weeks.    SIGNATURES/CONFIDENTIALITY: You and/or your care partner have signed paperwork which will be entered into your electronic medical record.  These signatures attest to the fact that that the information above on your After Visit Summary has been reviewed and is understood.  Full responsibility of the confidentiality of this discharge information lies with you and/or your care-partner. 

## 2015-06-04 NOTE — Op Note (Signed)
[place]  [address]. [place], [address]   COLONOSCOPY PROCEDURE REPORT  PATIENT: Alicia Gould, Alicia Gould  MR#: [id number] BIRTHDATE: July 20, 1965 , 49  yrs. old GENDER: female ENDOSCOPIST: Jerene Bears, MD REFERRED BY: Luiz Blare, DO PROCEDURE DATE:  06/04/2015 PROCEDURE:   Colonoscopy, screening and Colonoscopy with cold biopsy polypectomy First Screening Colonoscopy - Avg.  risk and is 50 yrs.  old or older Yes.  Prior Negative Screening - Now for repeat screening. N/A  History of Adenoma - Now for follow-up colonoscopy & has been > or = to 3 yrs.  N/A  Polyps removed today? Yes ASA CLASS:   Class II INDICATIONS:Screening for colonic neoplasia, Colorectal Neoplasm Risk Assessment for this procedure is average risk, and 1st colonoscopy. MEDICATIONS: Monitored anesthesia care and Propofol 300 mg IV  DESCRIPTION OF PROCEDURE:   After the risks benefits and alternatives of the procedure were thoroughly explained, informed consent was obtained.  The digital rectal exam revealed no rectal mass.   The LB [id number]  endoscope was introduced through the anus and advanced to the terminal ileum which was intubated for a short distance. No adverse events experienced.   The quality of the prep was good.  (Suprep was used)  The instrument was then slowly withdrawn as the colon was fully examined. Estimated blood loss is zero unless otherwise noted in this procedure report.  COLON FINDINGS: The examined terminal ileum appeared to be normal. Two sessile polyps ranging from 3 to 19mm in size were found in the rectosigmoid colon.  Polypectomies were performed with cold forceps.  The resection was complete, the polyp tissue was completely retrieved and sent to histology.   There was mild diverticulosis noted in the sigmoid colon.  Retroflexed views revealed small internal hemorrhoids. The time to cecum = 2.6 Withdrawal time = 10.0   The scope was withdrawn and the  procedure completed. COMPLICATIONS: There were no immediate complications.  ENDOSCOPIC IMPRESSION: 1.   The examined terminal ileum appeared to be normal 2.   Two sessile polyps ranging from 3 to 19mm in size were found in the rectosigmoid colon; polypectomies were performed with cold forceps 3.   Mild diverticulosis was noted in the sigmoid colon  RECOMMENDATIONS: 1.  Await pathology results 2.  High fiber diet 3.  If the polyps removed today are proven to be adenomatous (pre-cancerous) polyps, you will need a repeat colonoscopy in 5 years.  Otherwise you should continue to follow colorectal cancer screening guidelines for "routine risk" patients with colonoscopy in 10 years.  You will receive a letter within 1-2 weeks with the results of your biopsy as well as final recommendations.  Please call my office if you have not received a letter after 3 weeks.  eSigned:  Jerene Bears, MD 06/04/2015 9:34 AM cc:  the patient, PCP

## 2015-06-04 NOTE — Progress Notes (Signed)
Report to PACU, RN, vss, BBS= Clear.  

## 2015-06-05 ENCOUNTER — Telehealth: Payer: Self-pay | Admitting: *Deleted

## 2015-06-05 NOTE — Telephone Encounter (Signed)
  Follow up Call-  Call back number 06/04/2015  Post procedure Call Back phone  # [phone]  Permission to leave phone message Yes     Patient questions:  Do you have a fever, pain , or abdominal swelling? No. Pain Score  0 *  Have you tolerated food without any problems? Yes.    Have you been able to return to your normal activities? Yes.    Do you have any questions about your discharge instructions: Diet   No. Medications  No. Follow up visit  No.  Do you have questions or concerns about your Care? No.  Actions: * If pain score is 4 or above: No action needed, pain <4.

## 2015-06-09 ENCOUNTER — Encounter: Payer: Self-pay | Admitting: Internal Medicine

## 2015-06-10 ENCOUNTER — Ambulatory Visit (INDEPENDENT_AMBULATORY_CARE_PROVIDER_SITE_OTHER): Payer: BLUE CROSS/BLUE SHIELD | Admitting: Obstetrics and Gynecology

## 2015-06-10 ENCOUNTER — Encounter: Payer: Self-pay | Admitting: Obstetrics and Gynecology

## 2015-06-10 VITALS — BP 115/72 | HR 80 | Temp 98.9°F | Ht 68.0 in | Wt 174.1 lb

## 2015-06-10 DIAGNOSIS — R1013 Epigastric pain: Secondary | ICD-10-CM

## 2015-06-10 DIAGNOSIS — Z87891 Personal history of nicotine dependence: Secondary | ICD-10-CM | POA: Diagnosis not present

## 2015-06-10 NOTE — Assessment & Plan Note (Signed)
Diagnosed last month with PUD due to H.pylori. She completed her triple therapy course. Symptoms have now resolved and she is feeling better. She has learned what things cause her to flare up. Encouraged continued use of her PPI. Follow-up as needed. Handout given.

## 2015-06-10 NOTE — Progress Notes (Signed)
    Subjective: Chief Complaint  Patient presents with  . Abdominal Pain    h pylori    HPI: Alicia Gould is a 49 y.o. presenting to clinic today to discuss the following:  # Follow-up: -was diagnosed with H. Pylori -treated with triple therapy and completed course -now only taking Nexium -feels like her symptoms have improved and abdominal swelling resolved -also recently had colonoscopy and had two polyps removed; also found mild diverticulitis in sigmoid colon -she denies any blood from stool; color normal -able to pass gas better   Quit smoking tobacco about a month ago  ROS in HPI.  Past Medical, Surgical, Social, and Family History Reviewed & Updated per EMR.   Objective: BP 115/72 mmHg  Pulse 80  Temp(Src) 98.9 F (37.2 C) (Oral)  Ht 5\' 8"  (1.727 m)  Wt 174 lb 1.6 oz (78.971 kg)  BMI 26.48 kg/m2  LMP 10/16/2012  Physical Exam  Constitutional: She is well-developed, well-nourished, and in no distress.  Cardiovascular: Normal rate.   Pulmonary/Chest: Effort normal.  Abdominal: Soft. Normal appearance and bowel sounds are normal. She exhibits no mass. There is no hepatosplenomegaly. There is no tenderness.  Neurological: She is alert.  Skin: Skin is warm and dry.   Assessment/Plan: Please see problem based Assessment and [place], DO 06/10/2015, 10:40 AM PGY-2, [place]

## 2015-06-10 NOTE — Patient Instructions (Signed)
Food Choices for Peptic Ulcer Disease When you have peptic ulcer disease, the foods you eat and your eating habits are very important. Choosing the right foods can help ease the discomfort of peptic ulcer disease. WHAT GENERAL GUIDELINES DO I NEED TO FOLLOW?  Choose fruits, vegetables, whole grains, and low-fat meat, fish, and poultry.   Keep a food diary to identify foods that cause symptoms.  Avoid foods that cause irritation or pain. These may be different for different people.  Eat frequent small meals instead of three large meals each day. The pain may be worse when your stomach is empty.  Avoid eating close to bedtime. WHAT FOODS ARE NOT RECOMMENDED? The following are some foods and drinks that may worsen your symptoms:  Black, white, and red pepper.  Hot sauce.  Chili peppers.  Chili powder.  Chocolate and cocoa.   Alcohol.  Tea, coffee, and cola (regular and decaffeinated). The items listed above may not be a complete list of foods and beverages to avoid. Contact your dietitian for more information.   This information is not intended to replace advice given to you by your health care provider. Make sure you discuss any questions you have with your health care provider.   Document Released: 09/26/2011 Document Revised: 07/09/2013 Document Reviewed: 05/08/2013 Elsevier Interactive Patient Education 2016 [employer].  Diverticulitis Diverticulitis is inflammation or infection of small pouches in your colon that form when you have a condition called diverticulosis. The pouches in your colon are called diverticula. Your colon, or large intestine, is where water is absorbed and stool is formed. Complications of diverticulitis can include:  Bleeding.  Severe infection.  Severe pain.  Perforation of your colon.  Obstruction of your colon. CAUSES  Diverticulitis is caused by bacteria. Diverticulitis happens when stool becomes trapped in diverticula. This allows  bacteria to grow in the diverticula, which can lead to inflammation and infection. RISK FACTORS People with diverticulosis are at risk for diverticulitis. Eating a diet that does not include enough fiber from fruits and vegetables may make diverticulitis more likely to develop. SYMPTOMS  Symptoms of diverticulitis may include:  Abdominal pain and tenderness. The pain is normally located on the left side of the abdomen, but may occur in other areas.  Fever and chills.  Bloating.  Cramping.  Nausea.  Vomiting.  Constipation.  Diarrhea.  Blood in your stool. DIAGNOSIS  Your health care provider will ask you about your medical history and do a physical exam. You may need to have tests done because many medical conditions can cause the same symptoms as diverticulitis. Tests may include:  Blood tests.  Urine tests.  Imaging tests of the abdomen, including X-rays and CT scans. When your condition is under control, your health care provider may recommend that you have a colonoscopy. A colonoscopy can show how severe your diverticula are and whether something else is causing your symptoms. TREATMENT  Most cases of diverticulitis are mild and can be treated at home. Treatment may include:  Taking over-the-counter pain medicines.  Following a clear liquid diet.  Taking antibiotic medicines by mouth for 7-10 days. More severe cases may be treated at a hospital. Treatment may include:  Not eating or drinking.  Taking prescription pain medicine.  Receiving antibiotic medicines through an IV tube.  Receiving fluids and nutrition through an IV tube.  Surgery. HOME CARE INSTRUCTIONS   Follow your health care provider's instructions carefully.  Follow a full liquid diet or other diet as directed by  your health care provider. After your symptoms improve, your health care provider may tell you to change your diet. He or she may recommend you eat a high-fiber diet. Fruits and  vegetables are good sources of fiber. Fiber makes it easier to pass stool.  Take fiber supplements or probiotics as directed by your health care provider.  Only take medicines as directed by your health care provider.  Keep all your follow-up appointments. SEEK MEDICAL CARE IF:   Your pain does not improve.  You have a hard time eating food.  Your bowel movements do not return to normal. SEEK IMMEDIATE MEDICAL CARE IF:   Your pain becomes worse.  Your symptoms do not get better.  Your symptoms suddenly get worse.  You have a fever.  You have repeated vomiting.  You have bloody or black, tarry stools. MAKE SURE YOU:   Understand these instructions.  Will watch your condition.  Will get help right away if you are not doing well or get worse.   This information is not intended to replace advice given to you by your health care provider. Make sure you discuss any questions you have with your health care provider.   Document Released: 04/13/2005 Document Revised: 07/09/2013 Document Reviewed: 05/29/2013 Elsevier Interactive Patient Education [employer].

## 2015-06-10 NOTE — Assessment & Plan Note (Signed)
Recently quit tobacco use last month after H.pylori diagnosis. States she is having no cravings. Offered her support or supplements if she needs to maintain abstienence. Congratulated her on quitting and encouraged continued abstinence.

## 2016-03-24 ENCOUNTER — Other Ambulatory Visit: Payer: Self-pay | Admitting: Family Medicine

## 2016-03-24 DIAGNOSIS — Z1231 Encounter for screening mammogram for malignant neoplasm of breast: Secondary | ICD-10-CM

## 2016-04-13 ENCOUNTER — Ambulatory Visit
Admission: RE | Admit: 2016-04-13 | Discharge: 2016-04-13 | Disposition: A | Discharge status: Home / Self Care | Payer: BLUE CROSS/BLUE SHIELD | Source: Ambulatory Visit | Attending: Family Medicine | Admitting: Family Medicine

## 2016-04-13 DIAGNOSIS — Z1231 Encounter for screening mammogram for malignant neoplasm of breast: Secondary | ICD-10-CM

## 2016-04-14 ENCOUNTER — Ambulatory Visit (INDEPENDENT_AMBULATORY_CARE_PROVIDER_SITE_OTHER): Payer: BLUE CROSS/BLUE SHIELD | Admitting: Obstetrics and Gynecology

## 2016-04-14 VITALS — BP 151/107 | HR 94 | Temp 98.3°F | Ht 68.0 in | Wt 187.6 lb

## 2016-04-14 DIAGNOSIS — Z23 Encounter for immunization: Secondary | ICD-10-CM

## 2016-04-14 DIAGNOSIS — M545 Low back pain, unspecified: Secondary | ICD-10-CM

## 2016-04-14 LAB — POCT URINALYSIS DIPSTICK
Bilirubin, UA: NEGATIVE
Glucose, UA: NEGATIVE
KETONES UA: NEGATIVE
Leukocytes, UA: NEGATIVE
Nitrite, UA: NEGATIVE
PH UA: 6.5
PROTEIN UA: NEGATIVE
RBC UA: NEGATIVE
SPEC GRAV UA: 1.015
UROBILINOGEN UA: 0.2

## 2016-04-14 NOTE — Progress Notes (Signed)
     Subjective: Chief Complaint  Patient presents with  . Back Pain     HPI: Alicia Gould is a 50 y.o. presenting to clinic today to discuss the following:  #BACK PAIN Has been having back pain for 2 weeks now Localizes it to the left side and lower back to midline.   Pain radiates no History of trauma or injury: no Prior history of similar pain: no Works at [employer] standing Pain comes and goes  Symptoms Incontinence of bowel or bladder:  no Numbness of leg: no Fever: no Rash: no Dysuria: no Urinary Frequency: yes  Urgency: no Blood in urine: no  Patient believes it might be due to a UTI causing their pain. Last UTI fever a year ago  ROS noted in HPI.  Past Medical, Surgical, Social, and Family History Reviewed & Updated per EMR. Smoking status - Former Smoker   Objective: BP (!) 151/107 (BP Location: Left Arm, Patient Position: Sitting, Cuff Size: Normal)   Pulse 94   Temp 98.3 F (36.8 C) (Oral)   Ht 5\' 8"  (1.727 m)   Wt 187 lb 9.6 oz (85.1 kg)   LMP 10/16/2012   SpO2 100%   BMI 28.52 kg/m  Vitals and nursing notes reviewed  Physical Exam  Constitutional: She is well-developed, well-nourished, and in no distress.  Abdominal: Soft. Normal appearance and bowel sounds are normal. There is no tenderness. There is no CVA tenderness.  Musculoskeletal:       Lumbar back: Normal. She exhibits normal range of motion, no tenderness and no deformity.  Neurological: She has normal sensation and normal strength. Gait normal.     Results for orders placed or performed in visit on 04/14/16 (from the past 72 hour(s))  POCT urinalysis dipstick     Status: None   Collection Time: 04/14/16 11:08 AM  Result Value Ref Range   Color, UA YELLOW    Clarity, UA CLEAR    Glucose, UA NEG    Bilirubin, UA NEG    Ketones, UA NEG    Spec Grav, UA 1.015    Blood, UA NEG    pH, UA 6.5    Protein, UA NEG    Urobilinogen, UA 0.2    Nitrite, UA NEG    Leukocytes, UA  Negative Negative    Assessment/Plan: 1. Acute midline low back pain without sciatica Acute back pain not precipitated by injury. Exam unremarkable. UA without signs of UTI.  Discussed proper foot wear with patient as she stands a lot at work without proper support. Conservative treatment. Back exercises and handout given. She is to take OTC pain relief. Reevaluate prn.  - POCT urinalysis dipstick  2. Immunization due - Flu - Tdap vaccine greater than or equal to 7yo IM   Orders Placed This Encounter  Procedures  . Flu Vaccine QUAD 36+ mos IM  . Tdap vaccine greater than or equal to 7yo IM  . POCT urinalysis dipstick    No orders of the defined types were placed in this encounter.    Luiz Blare, DO 04/14/2016, 11:19 AM PGY-3, [facility]

## 2016-04-14 NOTE — Patient Instructions (Signed)
No signs of UTI on labs Cold seems to be viral should get better in next couple of days continue medications   Back Pain, Adult Back pain is very common in adults.The cause of back pain is rarely dangerous and the pain often gets better over time.The cause of your back pain may not be known. Some common causes of back pain include:  Strain of the muscles or ligaments supporting the spine.  Wear and tear (degeneration) of the spinal disks.  Arthritis.  Direct injury to the back. For many people, back pain may return. Since back pain is rarely dangerous, most people can learn to manage this condition on their own. HOME CARE INSTRUCTIONS Watch your back pain for any changes. The following actions may help to lessen any discomfort you are feeling:  Remain active. It is stressful on your back to sit or stand in one place for long periods of time. Do not sit, drive, or stand in one place for more than 30 minutes at a time. Take short walks on even surfaces as soon as you are able.Try to increase the length of time you walk each day.  Exercise regularly as directed by your health care provider. Exercise helps your back heal faster. It also helps avoid future injury by keeping your muscles strong and flexible.  Do not stay in bed.Resting more than 1-2 days can delay your recovery.  Pay attention to your body when you bend and lift. The most comfortable positions are those that put less stress on your recovering back. Always use proper lifting techniques, including:  Bending your knees.  Keeping the load close to your body.  Avoiding twisting.  Find a comfortable position to sleep. Use a firm mattress and lie on your side with your knees slightly bent. If you lie on your back, put a pillow under your knees.  Avoid feeling anxious or stressed.Stress increases muscle tension and can worsen back pain.It is important to recognize when you are anxious or stressed and learn ways to manage  it, such as with exercise.  Take medicines only as directed by your health care provider. Over-the-counter medicines to reduce pain and inflammation are often the most helpful.Your health care provider may prescribe muscle relaxant drugs.These medicines help dull your pain so you can more quickly return to your normal activities and healthy exercise.  Apply ice to the injured area:  Put ice in a plastic bag.  Place a towel between your skin and the bag.  Leave the ice on for 20 minutes, 2-3 times a day for the first 2-3 days. After that, ice and heat may be alternated to reduce pain and spasms.  Maintain a healthy weight. Excess weight puts extra stress on your back and makes it difficult to maintain good posture. SEEK MEDICAL CARE IF:  You have pain that is not relieved with rest or medicine.  You have increasing pain going down into the legs or buttocks.  You have pain that does not improve in one week.  You have night pain.  You lose weight.  You have a fever or chills. SEEK IMMEDIATE MEDICAL CARE IF:   You develop new bowel or bladder control problems.  You have unusual weakness or numbness in your arms or legs.  You develop nausea or vomiting.  You develop abdominal pain.  You feel faint.   This information is not intended to replace advice given to you by your health care provider. Make sure you discuss any questions you  have with your health care provider.   Document Released: 07/04/2005 Document Revised: 07/25/2014 Document Reviewed: 11/05/2013 Elsevier Interactive Patient Education [employer].

## 2016-04-17 ENCOUNTER — Encounter: Payer: Self-pay | Admitting: Obstetrics and Gynecology

## 2016-07-19 ENCOUNTER — Encounter (HOSPITAL_COMMUNITY): Payer: Self-pay

## 2016-07-19 ENCOUNTER — Emergency Department (HOSPITAL_COMMUNITY)
Admission: EM | Admit: 2016-07-19 | Discharge: 2016-07-19 | Disposition: A | Discharge status: Home / Self Care | Payer: BLUE CROSS/BLUE SHIELD | Attending: Emergency Medicine | Admitting: Emergency Medicine

## 2016-07-19 DIAGNOSIS — J029 Acute pharyngitis, unspecified: Secondary | ICD-10-CM | POA: Diagnosis not present

## 2016-07-19 DIAGNOSIS — Z87891 Personal history of nicotine dependence: Secondary | ICD-10-CM | POA: Insufficient documentation

## 2016-07-19 LAB — RAPID STREP SCREEN (MED CTR MEBANE ONLY): Streptococcus, Group A Screen (Direct): NEGATIVE

## 2016-07-19 NOTE — ED Triage Notes (Signed)
Onset 07/16/16 sore throat, head and chest congestion.  No cough.  No one in household sick.  Throat red with pus.

## 2016-07-19 NOTE — ED Notes (Signed)
See PA assessment - one touch

## 2016-07-19 NOTE — ED Provider Notes (Signed)
[facility] DEPT Provider Note   CSN: [id number] Arrival date & time: 07/19/16  2158  By signing my name below, I, Reola Mosher, attest that this documentation has been prepared under the direction and in the presence of Etta Quill, NP. Electronically Signed: Reola Mosher, ED Scribe. 07/19/16. 11:30 PM.  History   Chief Complaint Chief Complaint  Patient presents with  . Sore Throat   The history is provided by the patient. No language interpreter was used.  Sore Throat  This is a new problem. The current episode started more than 2 days ago. The problem has been gradually worsening. Pertinent negatives include no abdominal pain. The symptoms are aggravated by swallowing. Nothing relieves the symptoms. She has tried nothing for the symptoms.    HPI Comments: Alicia Gould is a 51 y.o. female with a h/o anemia and GERD, who presents to the Emergency Department complaining of persistent, gradually worsening sore throat onset three days ago. She reports associated nasal/chest congestion, difficulty swallowing, subjective fever, and chest tightness secondary to her sore throat. Pt is able to tolerate their own secretions, but pain is exacerbated w/ swallowing. No treatments for her symptoms were tried prior to coming into the ED. No sick contacts with similar symptoms. Pt did receive her yearly influenza vaccination this year. She denies cough, abdominal pain, nausea, vomiting, rash, or any other associated symptoms.   Past Medical History:  Diagnosis Date  . Anemia   . GERD (gastroesophageal reflux disease)   . Helicobacter pylori infection    Patient Active Problem List   Diagnosis Date Noted  . History of tobacco use disorder 06/10/2015  . Abdominal pain, epigastric 04/22/2015  . Fatty tumor 10/30/2013   Past Surgical History:  Procedure Laterality Date  . TUBAL LIGATION    . UPPER GI ENDOSCOPY     OB History    No data available     Home Medications     Prior to Admission medications   Medication Sig Start Date End Date Taking? Authorizing Provider  Esomeprazole Magnesium (NEXIUM 24HR) 20 MG TBEC Take 40 mg by mouth daily. 04/22/15   Olam Idler, MD   Family History Family History  Problem Relation Age of Onset  . Ovarian cancer Maternal Grandmother   . Hypertension Brother   . Heart disease Brother   . Diabetes Brother   . Cancer Brother     prostate  . Diabetes Maternal Uncle   . Colon cancer Neg Hx   . Esophageal cancer Neg Hx    Social History Social History  Substance Use Topics  . Smoking status: Former Smoker    Packs/day: 0.50    Types: Cigarettes    Quit date: 04/21/2015  . Smokeless tobacco: Never Used  . Alcohol use 0.0 oz/week     Comment: 3-4 glasses/month   Allergies   Patient has no known allergies.   Review of Systems Review of Systems  Constitutional: Positive for fever (subjective).  HENT: Positive for congestion, sore throat and trouble swallowing.   Respiratory: Positive for chest tightness. Negative for cough.   Gastrointestinal: Negative for abdominal pain, nausea and vomiting.  All other systems reviewed and are negative.  Physical Exam Updated Vital Signs BP 162/93 (BP Location: Left Arm)   Pulse 83   Temp 98.3 F (36.8 C) (Oral)   Resp 18   LMP 10/16/2012   SpO2 100%   Physical Exam  Constitutional: She appears well-developed and well-nourished. No distress.  HENT:  Head: Normocephalic and atraumatic.  Mouth/Throat: Uvula is midline and mucous membranes are normal. Posterior oropharyngeal erythema present. No oropharyngeal exudate, posterior oropharyngeal edema or tonsillar abscesses. Tonsillar exudate.  Eyes: Conjunctivae are normal.  Neck: Normal range of motion.  Cardiovascular: Normal rate.   Pulmonary/Chest: Effort normal.  Abdominal: She exhibits no distension.  Musculoskeletal: Normal range of motion.  Lymphadenopathy:    She has no cervical adenopathy.  Neurological:  She is alert.  Skin: No pallor.  Psychiatric: She has a normal mood and affect. Her behavior is normal.  Nursing note and vitals reviewed.  ED Treatments / Results  DIAGNOSTIC STUDIES: Oxygen Saturation is 100% on RA, normal by my interpretation.   COORDINATION OF CARE: 11:05 PM-Discussed next steps with pt. Pt verbalized understanding and is agreeable with the plan.   Labs (all labs ordered are listed, but only abnormal results are displayed) Labs Reviewed  RAPID STREP SCREEN (NOT AT [facility])   EKG  EKG Interpretation None      Radiology No results found.  Procedures Procedures   Medications Ordered in ED Medications - No data to display  Initial Impression / Assessment and Plan / ED Course  I have reviewed the triage vital signs and the nursing notes.  Pertinent labs & imaging results that were available during my care of the patient were reviewed by me and considered in my medical decision making (see chart for details).  Clinical Course    Pt afebrile with tonsillar exudate, but w/ negative strep. Presents with no cervical lymphadenopathy, & some dysphagia; diagnosis of viral pharyngitis. No abx indicated. DC w symptomatic tx for pain  Pt does not appear dehydrated, but did discuss importance of oral rehydration. Presentation non concerning for PTA or infxn spread to soft tissue. No trismus or uvula deviation. Specific return precautions discussed. Pt able to drink water in ED without difficulty with intact air way. Recommended PCP follow up.  Final Clinical Impressions(s) / ED Diagnoses   Final diagnoses:  Sore throat   New Prescriptions Discharge Medication List as of 07/19/2016 11:36 PM     I personally performed the services described in this documentation, which was scribed in my presence. The recorded information has been reviewed and is accurate.     Etta Quill, NP 07/20/16 0011    Forde Dandy, MD 07/20/16 [phone]

## 2016-07-22 LAB — CULTURE, GROUP A STREP (THRC)

## 2016-12-22 ENCOUNTER — Ambulatory Visit (INDEPENDENT_AMBULATORY_CARE_PROVIDER_SITE_OTHER): Payer: BLUE CROSS/BLUE SHIELD | Admitting: Obstetrics and Gynecology

## 2016-12-22 ENCOUNTER — Encounter: Payer: Self-pay | Admitting: Obstetrics and Gynecology

## 2016-12-22 VITALS — BP 126/80 | HR 82 | Temp 98.5°F | Wt 215.5 lb

## 2016-12-22 DIAGNOSIS — R1013 Epigastric pain: Secondary | ICD-10-CM

## 2016-12-22 DIAGNOSIS — M791 Myalgia, unspecified site: Secondary | ICD-10-CM

## 2016-12-22 MED ORDER — OMEPRAZOLE 40 MG PO CPDR: 40.0000 mg | DELAYED_RELEASE_CAPSULE | Freq: Every day | ORAL | 0 refills | Status: DC

## 2016-12-22 MED ORDER — CYCLOBENZAPRINE HCL 5 MG PO TABS: 5.0000 mg | ORAL_TABLET | Freq: Three times a day (TID) | ORAL | 0 refills | Status: DC | PRN

## 2016-12-22 MED ORDER — MENTHOL (TOPICAL ANALGESIC) 2.5 % EX GEL: CUTANEOUS | 0 refills | Status: DC

## 2016-12-22 NOTE — Progress Notes (Signed)
Subjective: Chief Complaint  Patient presents with  . Flank Pain    left under breast radiates to sholuder blade, repetetive motion as cashier      HPI: Alicia Gould is a 51 y.o. 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   Past Medical, Surgical, Social, and Family History Reviewed & Updated per EMR.   Pertinent Historical Findings include: None   History  Smoking Status  . Former Smoker  . Packs/day: 0.50  . Types: Cigarettes  . Quit date: 04/21/2015  Smokeless Tobacco  . Never Used     Objective: BP 126/80 (BP Location: Right Arm, Patient Position: Sitting, Cuff Size: Normal)   Pulse 82   Temp 98.5 F (36.9 C) (Oral)   Wt 215 lb 8 oz (97.8 kg)   LMP 10/16/2012   SpO2 96%   BMI 32.77 kg/m  Vitals and nursing notes reviewed  Physical Exam  Constitutional: She is well-developed, well-nourished, and in no distress.  Cardiovascular: Normal rate.   Pulmonary/Chest: Effort normal.  Abdominal: Soft. Normal appearance and bowel sounds are normal. There is no hepatosplenomegaly. There is tenderness in the epigastric area and left upper quadrant. There is no rigidity, no guarding and no CVA tenderness.  Musculoskeletal:       Back:  Neurological: She is alert.  Skin: Skin is warm and dry. No rash noted.     Assessment/Plan: 1. Dyspepsia Symptoms most consistent with indigestion. May have a reflux component. Patient has h/o H.pylori and PUD. At this time do not think she has PUD. No red flags. Will go ahead and treat with PPI to help with symptoms. If persist after 2 weeks would recheck H.pylori.   2. Trigger point Discrete area of tenderness around left scapula. Patient declined trigger point injection at this time. Preferred to do conservative measures like muscle rubs and muscle relaxant. Prescriptions given.   PATIENT EDUCATION PROVIDED: See AVS    Diagnosis and plan along with any newly prescribed medication(s) were discussed in detail with this patient today. The patient verbalized understanding and agreed with the plan. Patient advised if symptoms worsen return to clinic or ER.    Meds ordered this encounter  Medications  . omeprazole (PRILOSEC) 40 MG capsule    Sig: Take 1 capsule (40 mg total) by mouth daily.    Dispense:  30 capsule    Refill:  0  . Menthol, Topical Analgesic, 2.5 % GEL    Sig: Apply to back as needed for pain relief.    Dispense:  57 g    Refill:  0  . cyclobenzaprine (FLEXERIL) 5 MG tablet    Sig: Take 1 tablet (5 mg total) by mouth 3 (three) times daily as needed for muscle spasms.    Dispense:  20 tablet  Refill:  [place], DO 12/22/2016, 11:12 AM PGY-3, [facility]

## 2016-12-22 NOTE — Patient Instructions (Signed)
Heartburn Heartburn is a type of pain or discomfort that can happen in the throat or chest. It is often described as a burning pain. It may also cause a bad taste in the mouth. Heartburn may feel worse when you lie down or bend over, and it is often worse at night. Heartburn may be caused by stomach contents that move back up into the esophagus (reflux). Follow these instructions at home: Take these actions to decrease your discomfort and to help avoid complications. Diet  Follow a diet as recommended by your health care provider. This may involve avoiding foods and drinks such as: ? Coffee and tea (with or without caffeine). ? Drinks that contain alcohol. ? Energy drinks and sports drinks. ? Carbonated drinks or sodas. ? Chocolate and cocoa. ? Peppermint and mint flavorings. ? Garlic and onions. ? Horseradish. ? Spicy and acidic foods, including peppers, chili powder, curry powder, vinegar, hot sauces, and barbecue sauce. ? Citrus fruit juices and citrus fruits, such as oranges, lemons, and limes. ? Tomato-based foods, such as red sauce, chili, salsa, and pizza with red sauce. ? Fried and fatty foods, such as donuts, french fries, potato chips, and high-fat dressings. ? High-fat meats, such as hot dogs and fatty cuts of red and white meats, such as rib eye steak, sausage, ham, and bacon. ? High-fat dairy items, such as whole milk, butter, and cream cheese.  Eat small, frequent meals instead of large meals.  Avoid drinking large amounts of liquid with your meals.  Avoid eating meals during the 2-3 hours before bedtime.  Avoid lying down right after you eat.  Do not exercise right after you eat. General instructions  Pay attention to any changes in your symptoms.  Take over-the-counter and prescription medicines only as told by your health care provider. Do not take aspirin, ibuprofen, or other NSAIDs unless your health care provider told you to do so.  Do not use any tobacco  products, including cigarettes, chewing tobacco, and e-cigarettes. If you need help quitting, ask your health care provider.  Wear loose-fitting clothing. Do not wear anything tight around your waist that causes pressure on your abdomen.  Raise (elevate) the head of your bed about 6 inches (15 cm).  Try to reduce your stress, such as with yoga or meditation. If you need help reducing stress, ask your health care provider.  If you are overweight, reduce your weight to an amount that is healthy for you. Ask your health care provider for guidance about a safe weight loss goal.  Keep all follow-up visits as told by your health care provider. This is important. Contact a health care provider if:  You have new symptoms.  You have unexplained weight loss.  You have difficulty swallowing, or it hurts to swallow.  You have wheezing or a persistent cough.  Your symptoms do not improve with treatment.  You have frequent heartburn for more than two weeks. Get help right away if:  You have pain in your arms, neck, jaw, teeth, or back.  You feel sweaty, dizzy, or light-headed.  You have chest pain or shortness of breath.  You vomit and your vomit looks like blood or coffee grounds.  Your stool is bloody or black. This information is not intended to replace advice given to you by your health care provider. Make sure you discuss any questions you have with your health care provider. Document Released: 11/20/2008 Document Revised: 12/10/2015 Document Reviewed: 10/29/2014 Elsevier Interactive Patient Education  2017 [employer]  

## 2017-03-16 ENCOUNTER — Other Ambulatory Visit: Payer: Self-pay | Admitting: Sports Medicine

## 2017-03-16 DIAGNOSIS — Z1231 Encounter for screening mammogram for malignant neoplasm of breast: Secondary | ICD-10-CM

## 2017-03-22 ENCOUNTER — Ambulatory Visit (INDEPENDENT_AMBULATORY_CARE_PROVIDER_SITE_OTHER): Payer: BLUE CROSS/BLUE SHIELD | Admitting: *Deleted

## 2017-03-22 DIAGNOSIS — Z23 Encounter for immunization: Secondary | ICD-10-CM

## 2017-04-19 ENCOUNTER — Ambulatory Visit
Admission: RE | Admit: 2017-04-19 | Discharge: 2017-04-19 | Disposition: A | Discharge status: Home / Self Care | Payer: BLUE CROSS/BLUE SHIELD | Source: Ambulatory Visit | Attending: Sports Medicine | Admitting: Sports Medicine

## 2017-04-19 ENCOUNTER — Other Ambulatory Visit: Payer: Self-pay | Admitting: *Deleted

## 2017-04-19 DIAGNOSIS — Z1231 Encounter for screening mammogram for malignant neoplasm of breast: Secondary | ICD-10-CM

## 2018-04-20 ENCOUNTER — Other Ambulatory Visit: Payer: Self-pay | Admitting: Family Medicine

## 2018-04-20 DIAGNOSIS — Z1231 Encounter for screening mammogram for malignant neoplasm of breast: Secondary | ICD-10-CM

## 2018-05-30 ENCOUNTER — Ambulatory Visit
Admission: RE | Admit: 2018-05-30 | Discharge: 2018-05-30 | Disposition: A | Discharge status: Home / Self Care | Payer: BLUE CROSS/BLUE SHIELD | Source: Ambulatory Visit | Attending: Family Medicine | Admitting: Family Medicine

## 2018-05-30 DIAGNOSIS — Z1231 Encounter for screening mammogram for malignant neoplasm of breast: Secondary | ICD-10-CM

## 2019-06-19 ENCOUNTER — Other Ambulatory Visit: Payer: Self-pay

## 2019-06-19 DIAGNOSIS — Z20822 Contact with and (suspected) exposure to covid-19: Secondary | ICD-10-CM

## 2019-06-22 LAB — NOVEL CORONAVIRUS, NAA: SARS-CoV-2, NAA: NOT DETECTED

## 2021-12-21 ENCOUNTER — Encounter: Payer: Self-pay | Admitting: *Deleted

## 2021-12-22 NOTE — Progress Notes (Signed)
    SUBJECTIVE:   CHIEF COMPLAINT / HPI:   "Knot in leg": Patient with a history of a "knot" in her right inner thigh.  She was evaluated for concern for lipoma in 2015 and had an ultrasound which showed a 6.7 x 3.1 x 6.6 hypoechoic right medial thigh mass most likely a large lipoma but consideration for MRI as soft tissue tumor could not be excluded.  She was referred to general surgery for removal of the likely lipoma.  Since then she states it has been getting bigger and she has noticed a hard area in the middle of it..  She states that she thinks it got the hard lesion in the middle after her grandson stepped on it.  She denies any pain.  She states that this occurred years ago.  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  There is no pain with palpation of this entire lesion.  No overlying skin changes. 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  This was back in 2015.  She has noted it has been enlarging and there is a firm area in the middle of this tumor which is nonmobile and does not feel consistent with a lipoma.  Unsure if this is an area of hematoma or something as she thinks her grandson stepped on the area years ago prior to it hardening.  Discussed case with Dr. Andria Frames.  We will get her referred to general surgery.  That way they can order the particular intervention that will help him best as they prepare for surgery for this.  She may need ultrasound  versus an MRI to further evaluate the area.  Explained the importance of following up with general surgery in order to get this area removed.  Discussed follow-up as needed.  We will place referral for screening mammogram per patient request for health maintenance   Lurline Del, [place]

## 2021-12-23 ENCOUNTER — Ambulatory Visit: Payer: BC Managed Care – PPO | Admitting: Family Medicine

## 2021-12-23 ENCOUNTER — Encounter: Payer: Self-pay | Admitting: Family Medicine

## 2021-12-23 VITALS — BP 147/87 | HR 84 | Ht 67.0 in | Wt 238.2 lb

## 2021-12-23 DIAGNOSIS — Z1231 Encounter for screening mammogram for malignant neoplasm of breast: Secondary | ICD-10-CM | POA: Diagnosis not present

## 2021-12-23 DIAGNOSIS — D1723 Benign lipomatous neoplasm of skin and subcutaneous tissue of right leg: Secondary | ICD-10-CM | POA: Diagnosis not present

## 2021-12-23 NOTE — Patient Instructions (Signed)
I have placed the referral for general surgery.  It would be very important for you to go to this appointment.  They will likely need to do another imaging such as an ultrasound or an MRI.  If you have any difficulty getting in with them, if you do not hear from them in the next 1 to 2 weeks, or if you have other concerns please let me know.

## 2021-12-28 ENCOUNTER — Other Ambulatory Visit: Payer: Self-pay | Admitting: Family Medicine

## 2021-12-28 DIAGNOSIS — Z1231 Encounter for screening mammogram for malignant neoplasm of breast: Secondary | ICD-10-CM

## 2021-12-30 ENCOUNTER — Ambulatory Visit
Admission: RE | Admit: 2021-12-30 | Discharge: 2021-12-30 | Disposition: A | Discharge status: Home / Self Care | Payer: BC Managed Care – PPO | Source: Ambulatory Visit

## 2021-12-30 DIAGNOSIS — Z1231 Encounter for screening mammogram for malignant neoplasm of breast: Secondary | ICD-10-CM | POA: Diagnosis not present

## 2022-01-03 ENCOUNTER — Other Ambulatory Visit: Payer: Self-pay | Admitting: Family Medicine

## 2022-01-03 DIAGNOSIS — R928 Other abnormal and inconclusive findings on diagnostic imaging of breast: Secondary | ICD-10-CM

## 2022-01-13 ENCOUNTER — Ambulatory Visit
Admission: RE | Admit: 2022-01-13 | Discharge: 2022-01-13 | Disposition: A | Discharge status: Home / Self Care | Payer: BC Managed Care – PPO | Source: Ambulatory Visit | Attending: Family Medicine | Admitting: Family Medicine

## 2022-01-13 DIAGNOSIS — R928 Other abnormal and inconclusive findings on diagnostic imaging of breast: Secondary | ICD-10-CM

## 2022-01-13 DIAGNOSIS — N6001 Solitary cyst of right breast: Secondary | ICD-10-CM | POA: Diagnosis not present

## 2022-02-16 ENCOUNTER — Other Ambulatory Visit: Payer: Self-pay | Admitting: General Surgery

## 2022-02-16 DIAGNOSIS — Z86018 Personal history of other benign neoplasm: Secondary | ICD-10-CM

## 2022-02-16 DIAGNOSIS — D1723 Benign lipomatous neoplasm of skin and subcutaneous tissue of right leg: Secondary | ICD-10-CM | POA: Diagnosis not present

## 2022-02-16 DIAGNOSIS — M7989 Other specified soft tissue disorders: Secondary | ICD-10-CM

## 2022-03-02 ENCOUNTER — Ambulatory Visit
Admission: RE | Admit: 2022-03-02 | Discharge: 2022-03-02 | Disposition: A | Discharge status: Home / Self Care | Payer: BC Managed Care – PPO | Source: Ambulatory Visit | Attending: General Surgery | Admitting: General Surgery

## 2022-03-02 DIAGNOSIS — M7989 Other specified soft tissue disorders: Secondary | ICD-10-CM

## 2022-03-02 DIAGNOSIS — R2241 Localized swelling, mass and lump, right lower limb: Secondary | ICD-10-CM | POA: Diagnosis not present

## 2022-03-02 DIAGNOSIS — Z86018 Personal history of other benign neoplasm: Secondary | ICD-10-CM

## 2022-04-05 ENCOUNTER — Other Ambulatory Visit: Payer: Self-pay | Admitting: General Surgery

## 2022-04-05 DIAGNOSIS — D1723 Benign lipomatous neoplasm of skin and subcutaneous tissue of right leg: Secondary | ICD-10-CM | POA: Diagnosis not present

## 2023-08-11 IMAGING — MG MM DIGITAL SCREENING BILAT W/ TOMO AND CAD
8 series · 8 of 24 positions shown · non-contrast
Comparison: Previous exam(s).

CLINICAL DATA: Screening.

EXAM:
DIGITAL SCREENING BILATERAL MAMMOGRAM WITH TOMOSYNTHESIS AND CAD
TECHNIQUE: Bilateral screening digital craniocaudal and mediolateral oblique
mammograms were obtained. Bilateral screening digital breast
tomosynthesis was performed. The images were evaluated with
computer-aided detection.

[R CC synth-2D]
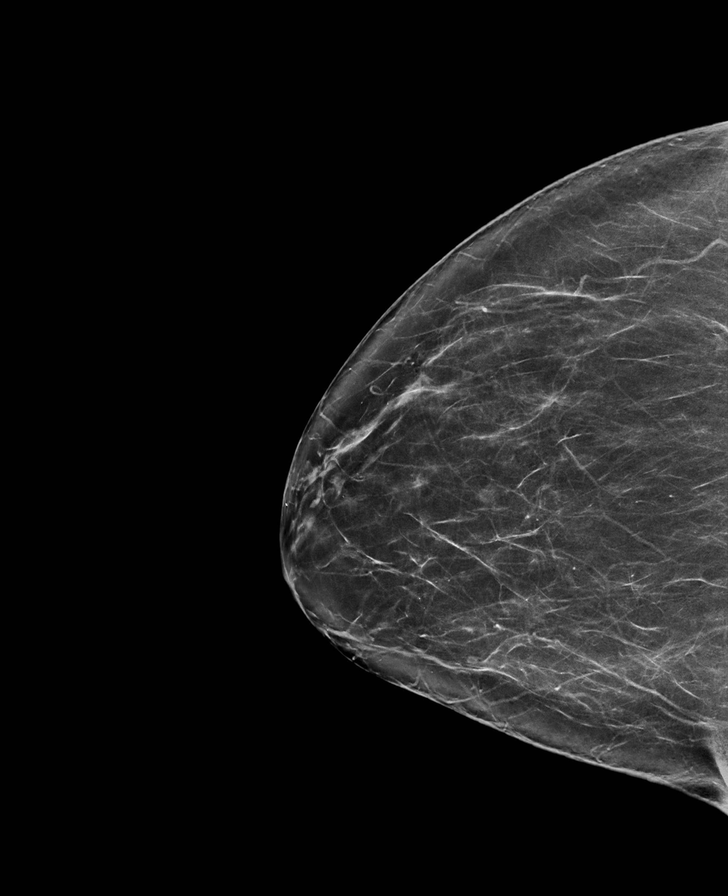

[L MLO synth-2D]
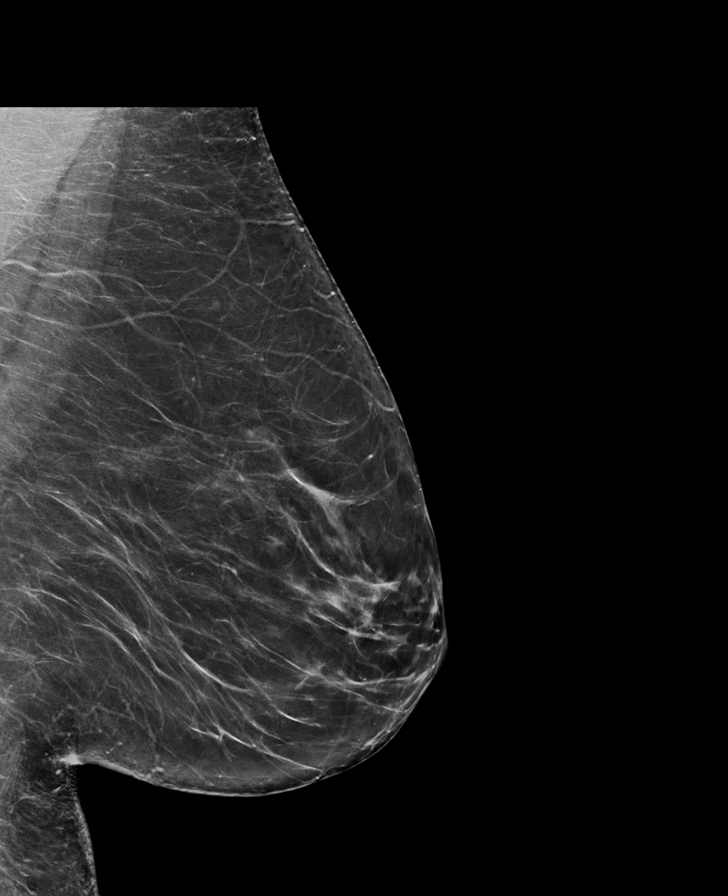

[R MLO synth-2D]
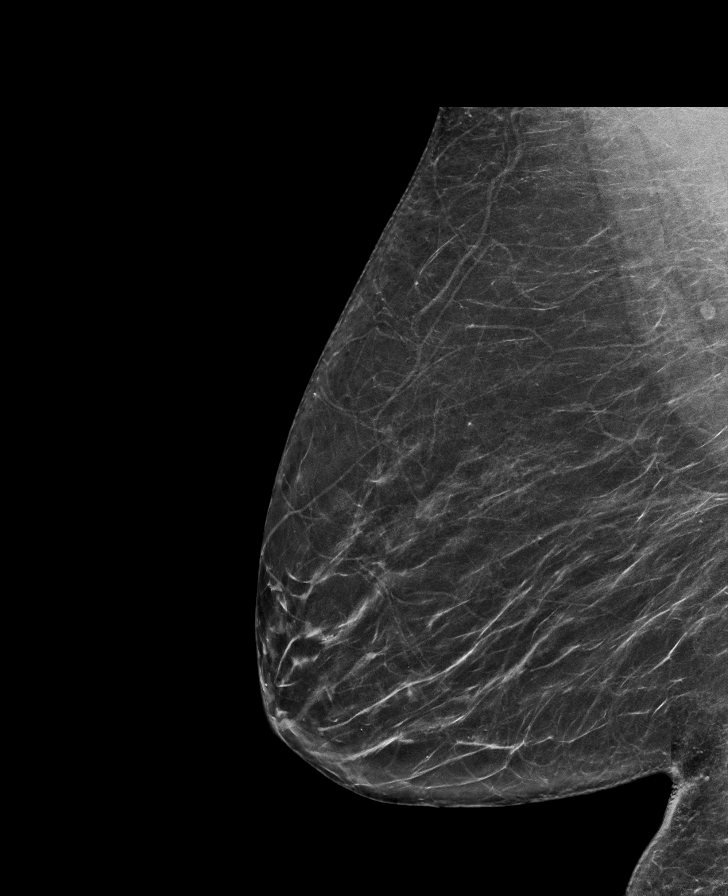

[L CC synth-2D]
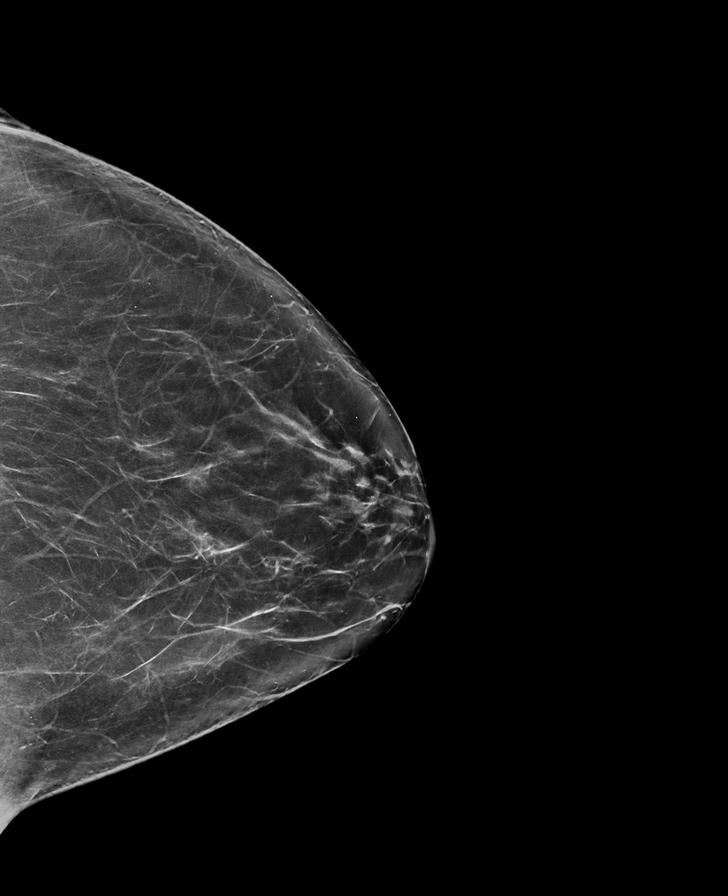

[R MLO tomo · tomo slice 37/74.0]
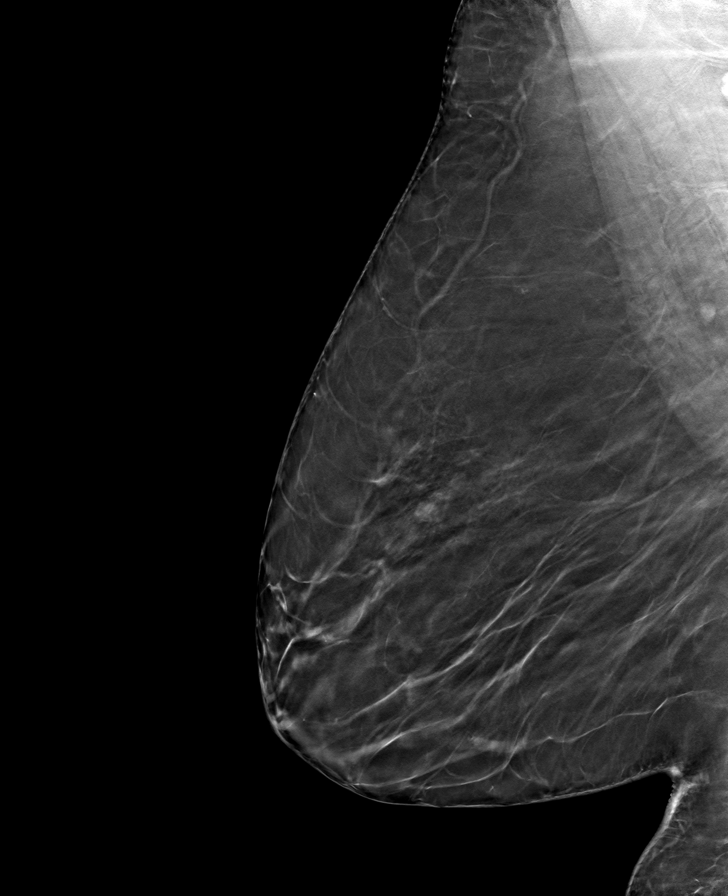

[R CC tomo · tomo slice 38/75.0]
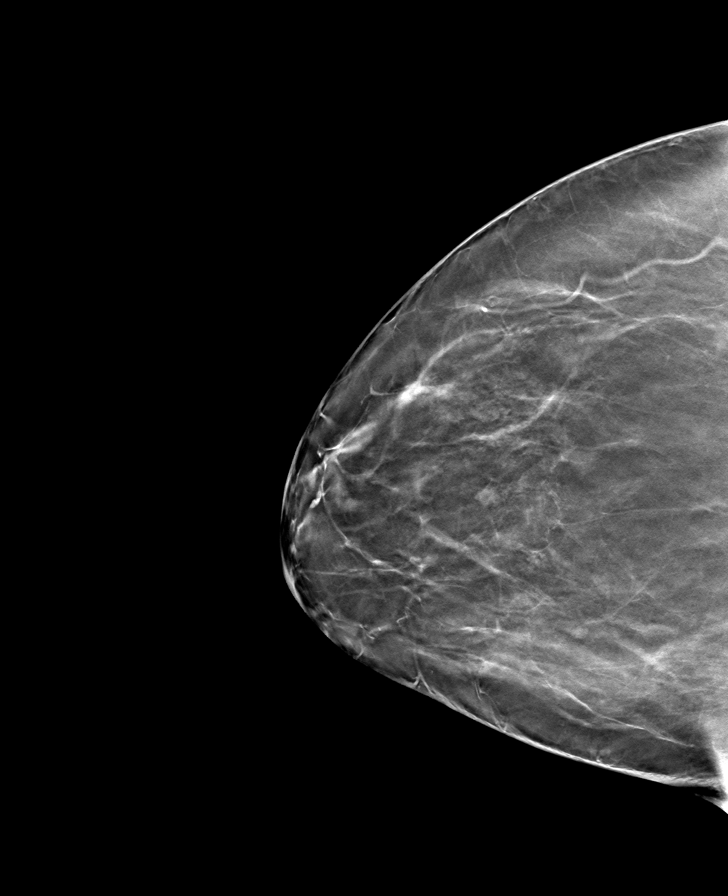

[L CC tomo · tomo slice 39/76.0]
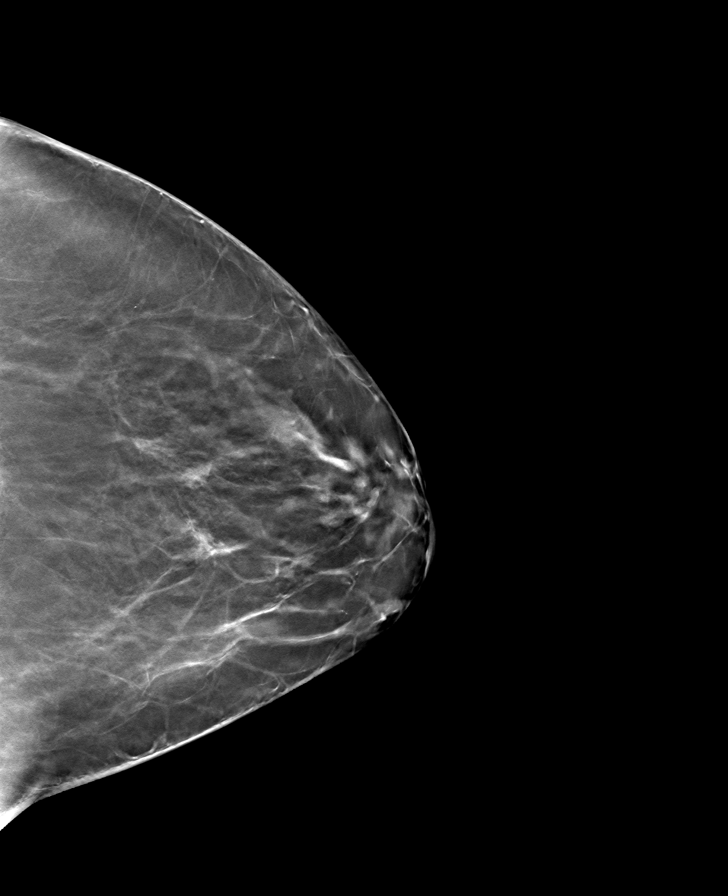

[L MLO tomo · tomo slice 39/77.0]
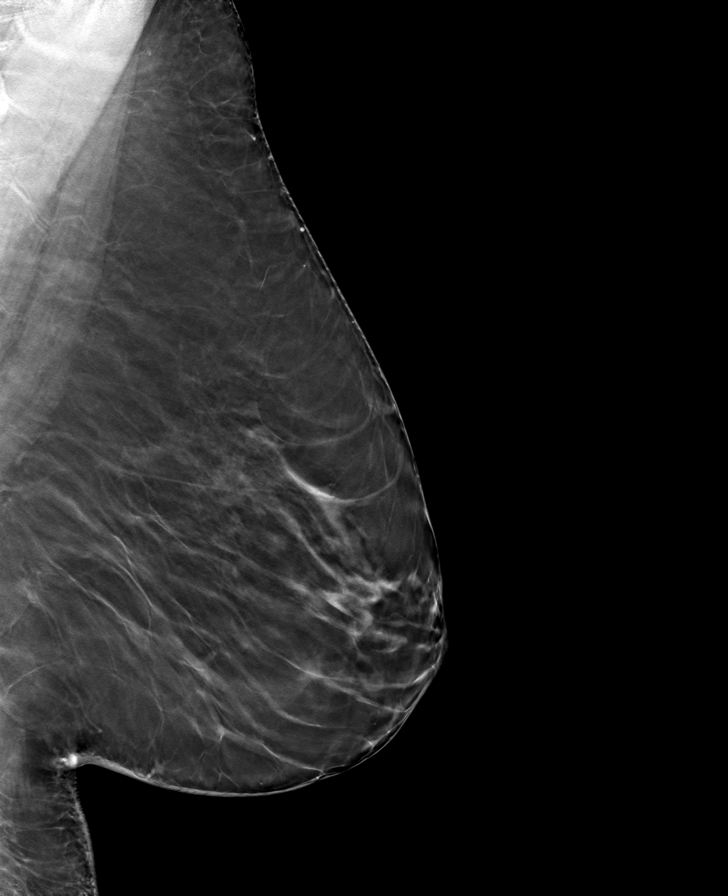

[8 of 24 positions shown; findings below may reference images not displayed]

ACR Breast Density Category b: There are scattered areas of
fibroglandular density.
FINDINGS: In the right breast, a possible mass warrants further evaluation. In
the left breast, no findings suspicious for malignancy.
IMPRESSION: Further evaluation is suggested for a possible mass in the right
breast.

RECOMMENDATION:
Diagnostic mammogram and possibly ultrasound of the right breast.
(Code:[id number]-[id number])

The patient will be contacted regarding the findings, and additional
imaging will be scheduled.

BI-RADS CATEGORY  0: Incomplete. Need additional imaging evaluation
and/or prior mammograms for comparison.

## 2023-12-26 ENCOUNTER — Encounter: Payer: Self-pay | Admitting: *Deleted

## 2024-01-01 ENCOUNTER — Ambulatory Visit
Admission: EM | Admit: 2024-01-01 | Discharge: 2024-01-01 | Disposition: A | Discharge status: Home / Self Care | Attending: Physician Assistant | Admitting: Physician Assistant

## 2024-01-01 ENCOUNTER — Encounter: Payer: Self-pay | Admitting: Emergency Medicine

## 2024-01-01 DIAGNOSIS — N3000 Acute cystitis without hematuria: Secondary | ICD-10-CM | POA: Diagnosis not present

## 2024-01-01 LAB — POCT URINALYSIS DIP (MANUAL ENTRY)
Bilirubin, UA: NEGATIVE
Glucose, UA: NEGATIVE mg/dL
Ketones, POC UA: NEGATIVE mg/dL
Nitrite, UA: NEGATIVE
Protein Ur, POC: NEGATIVE mg/dL
Spec Grav, UA: >=1.03 — AB (ref 1.010–1.025)
Urobilinogen, UA: 0.2 U/dL
pH, UA: 6 (ref 5.0–8.0)

## 2024-01-01 MED ORDER — NITROFURANTOIN MONOHYD MACRO 100 MG PO CAPS: 100.0000 mg | ORAL_CAPSULE | Freq: Two times a day (BID) | ORAL | 0 refills | Status: AC

## 2024-01-01 NOTE — ED Triage Notes (Signed)
 Pt reports dysuria that started last night. Also noted small amount of blood in urine as well.

## 2024-01-01 NOTE — ED Provider Notes (Signed)
 [facility]    CSN: [id number] Arrival date & time: 01/01/24  1033      History   Chief Complaint Chief Complaint  Patient presents with   Dysuria    HPI Alicia Gould is a 58 y.o. female.   Patient here today for evaluation of dysuria that started last night.  She denies any abdominal pain or back pain.  She has not had any vomiting or fever.  The history is provided by the patient.    Past Medical History:  Diagnosis Date   Anemia    GERD (gastroesophageal reflux disease)    Helicobacter pylori infection     Patient Active Problem List   Diagnosis Date Noted   History of tobacco use disorder 06/10/2015   Epigastric pain 04/22/2015   Fatty tumor 10/30/2013    Past Surgical History:  Procedure Laterality Date   TUBAL LIGATION     UPPER GI ENDOSCOPY      OB History   No obstetric history on file.      Home Medications    Prior to Admission medications   Medication Sig Start Date End Date Taking? Authorizing Provider  nitrofurantoin, macrocrystal-monohydrate, (MACROBID) 100 MG capsule Take 1 capsule (100 mg total) by mouth 2 (two) times daily. 01/01/24  Yes Vernestine Gondola, PA-C    Family History Family History  Problem Relation Age of Onset   Ovarian cancer Maternal Grandmother    Hypertension Brother    Heart disease Brother    Diabetes Brother    Cancer Brother        prostate   Diabetes Maternal Uncle    Colon cancer Neg Hx    Esophageal cancer Neg Hx     Social History Social History   Tobacco Use   Smoking status: Former    Current packs/day: 0.00    Types: Cigarettes    Quit date: 04/21/2015    Years since quitting: 8.7   Smokeless tobacco: Never  Vaping Use   Vaping status: Never Used  Substance Use Topics   Alcohol use: Yes    Alcohol/week: 0.0 standard drinks of alcohol    Comment: 3-4 glasses/month   Drug use: Yes    Types: Marijuana    Comment: daily     Allergies   Patient has no known  allergies.   Review of Systems Review of Systems  Constitutional:  Negative for chills and fever.  Eyes:  Negative for discharge and redness.  Respiratory:  Negative for shortness of breath.   Gastrointestinal:  Negative for abdominal pain, nausea and vomiting.  Genitourinary:  Positive for dysuria.     Physical Exam Triage Vital Signs ED Triage Vitals  Encounter Vitals Group     BP      Girls Systolic BP Percentile      Girls Diastolic BP Percentile      Boys Systolic BP Percentile      Boys Diastolic BP Percentile      Pulse      Resp      Temp      Temp src      SpO2      Weight      Height      Head Circumference      Peak Flow      Pain Score      Pain Loc      Pain Education      Exclude from Growth Chart    No data found.  Updated Vital Signs BP (!) 145/92 (BP Location: Left Arm)   Pulse 87   Temp 98.4 F (36.9 C) (Oral)   Resp 14   LMP 10/16/2012   SpO2 98%   Visual Acuity Right Eye Distance:   Left Eye Distance:   Bilateral Distance:    Right Eye Near:   Left Eye Near:    Bilateral Near:     Physical Exam Vitals and nursing note reviewed.  Constitutional:      General: She is not in acute distress.    Appearance: Normal appearance. She is not ill-appearing.  HENT:     Head: Normocephalic and atraumatic.   Eyes:     Conjunctiva/sclera: Conjunctivae normal.    Cardiovascular:     Rate and Rhythm: Normal rate.  Pulmonary:     Effort: Pulmonary effort is normal. No respiratory distress.   Neurological:     Mental Status: She is alert.   Psychiatric:        Mood and Affect: Mood normal.        Behavior: Behavior normal.        Thought Content: Thought content normal.      UC Treatments / Results  Labs (all labs ordered are listed, but only abnormal results are displayed) Labs Reviewed  POCT URINALYSIS DIP (MANUAL ENTRY) - Abnormal; Notable for the following components:      Result Value   Clarity, UA cloudy (*)    Spec  Grav, UA >=1.030 (*)    Blood, UA moderate (*)    Leukocytes, UA Small (1+) (*)    All other components within normal limits  URINE CULTURE    EKG   Radiology No results found.  Procedures Procedures (including critical care time)  Medications Ordered in UC Medications - No data to display  Initial Impression / Assessment and Plan / UC Course  I have reviewed the triage vital signs and the nursing notes.  Pertinent labs & imaging results that were available during my care of the patient were reviewed by me and considered in my medical decision making (see chart for details).    Will treat to cover UTI with Macrobid.  Urine culture ordered.  Advised follow-up if no gradual improvement or with any further concerns.  Final Clinical Impressions(s) / UC Diagnoses   Final diagnoses:  Acute cystitis without hematuria   Discharge Instructions   None    ED Prescriptions     Medication Sig Dispense Auth. Provider   nitrofurantoin, macrocrystal-monohydrate, (MACROBID) 100 MG capsule Take 1 capsule (100 mg total) by mouth 2 (two) times daily. 10 capsule Vernestine Gondola, PA-C      PDMP not reviewed this encounter.   Vernestine Gondola, PA-C 01/01/24 1932

## 2024-01-03 ENCOUNTER — Ambulatory Visit (HOSPITAL_COMMUNITY): Payer: Self-pay

## 2024-01-03 LAB — URINE CULTURE: Culture: >=100000 — AB

## 2024-01-03 MED ORDER — SULFAMETHOXAZOLE-TRIMETHOPRIM 800-160 MG PO TABS: 1.0000 | ORAL_TABLET | Freq: Two times a day (BID) | ORAL | 0 refills | Status: AC
